# Patient Record
Sex: Male | Born: 1980
Health system: Southern US, Community
[De-identification: ages and names within clinical notes are randomized; demographics above are authoritative.]

## PROBLEM LIST (undated history)

## (undated) DIAGNOSIS — G43909 Migraine, unspecified, not intractable, without status migrainosus: Secondary | ICD-10-CM

## (undated) DIAGNOSIS — F209 Schizophrenia, unspecified: Secondary | ICD-10-CM

## (undated) DIAGNOSIS — F319 Bipolar disorder, unspecified: Secondary | ICD-10-CM

## (undated) HISTORY — DX: Migraine, unspecified, not intractable, without status migrainosus: G43.909

---

## 2014-06-29 ENCOUNTER — Emergency Department (HOSPITAL_COMMUNITY): Payer: Self-pay

## 2014-06-29 ENCOUNTER — Other Ambulatory Visit (HOSPITAL_COMMUNITY): Payer: Self-pay

## 2014-06-29 ENCOUNTER — Emergency Department (HOSPITAL_COMMUNITY)
Admission: EM | Admit: 2014-06-29 | Discharge: 2014-06-30 | Disposition: A | Discharge status: Home / Self Care | Payer: Self-pay | Attending: Emergency Medicine | Admitting: Emergency Medicine

## 2014-06-29 ENCOUNTER — Encounter (HOSPITAL_COMMUNITY): Payer: Self-pay | Admitting: *Deleted

## 2014-06-29 DIAGNOSIS — F1310 Sedative, hypnotic or anxiolytic abuse, uncomplicated: Secondary | ICD-10-CM | POA: Insufficient documentation

## 2014-06-29 DIAGNOSIS — F1910 Other psychoactive substance abuse, uncomplicated: Secondary | ICD-10-CM

## 2014-06-29 DIAGNOSIS — F1410 Cocaine abuse, uncomplicated: Secondary | ICD-10-CM | POA: Insufficient documentation

## 2014-06-29 DIAGNOSIS — F1210 Cannabis abuse, uncomplicated: Secondary | ICD-10-CM | POA: Insufficient documentation

## 2014-06-29 DIAGNOSIS — F29 Unspecified psychosis not due to a substance or known physiological condition: Secondary | ICD-10-CM | POA: Insufficient documentation

## 2014-06-29 DIAGNOSIS — F23 Brief psychotic disorder: Secondary | ICD-10-CM

## 2014-06-29 HISTORY — DX: Schizophrenia, unspecified: F20.9

## 2014-06-29 HISTORY — DX: Bipolar disorder, unspecified: F31.9

## 2014-06-29 LAB — CBC WITH DIFFERENTIAL/PLATELET
BASOS ABS: 0 10*3/uL (ref 0.0–0.1)
BASOS PCT: 0 % (ref 0–1)
EOS PCT: 0 % (ref 0–5)
Eosinophils Absolute: 0 10*3/uL (ref 0.0–0.7)
HCT: 35.1 % — ABNORMAL LOW (ref 39.0–52.0)
Hemoglobin: 12.1 g/dL — ABNORMAL LOW (ref 13.0–17.0)
LYMPHS PCT: 25 % (ref 12–46)
Lymphs Abs: 1.6 10*3/uL (ref 0.7–4.0)
MCH: 32.2 pg (ref 26.0–34.0)
MCHC: 34.5 g/dL (ref 30.0–36.0)
MCV: 93.4 fL (ref 78.0–100.0)
Monocytes Absolute: 0.6 10*3/uL (ref 0.1–1.0)
Monocytes Relative: 9 % (ref 3–12)
Neutro Abs: 4.2 10*3/uL (ref 1.7–7.7)
Neutrophils Relative %: 66 % (ref 43–77)
PLATELETS: 245 10*3/uL (ref 150–400)
RBC: 3.76 MIL/uL — ABNORMAL LOW (ref 4.22–5.81)
RDW: 12.5 % (ref 11.5–15.5)
WBC: 6.4 10*3/uL (ref 4.0–10.5)

## 2014-06-29 LAB — URINALYSIS, ROUTINE W REFLEX MICROSCOPIC
BILIRUBIN URINE: NEGATIVE
GLUCOSE, UA: 100 mg/dL — AB
KETONES UR: NEGATIVE mg/dL
Leukocytes, UA: NEGATIVE
NITRITE: NEGATIVE
PH: 5.5 (ref 5.0–8.0)
PROTEIN: NEGATIVE mg/dL
Specific Gravity, Urine: 1.005 (ref 1.005–1.030)
UROBILINOGEN UA: 0.2 mg/dL (ref 0.0–1.0)

## 2014-06-29 LAB — URINE MICROSCOPIC-ADD ON

## 2014-06-29 LAB — RAPID URINE DRUG SCREEN, HOSP PERFORMED
Amphetamines: NOT DETECTED
BARBITURATES: NOT DETECTED
Benzodiazepines: POSITIVE — AB
Cocaine: POSITIVE — AB
Opiates: NOT DETECTED
TETRAHYDROCANNABINOL: POSITIVE — AB

## 2014-06-29 LAB — CBG MONITORING, ED
GLUCOSE-CAPILLARY: 64 mg/dL — AB (ref 70–99)
Glucose-Capillary: 142 mg/dL — ABNORMAL HIGH (ref 70–99)
Glucose-Capillary: 68 mg/dL — ABNORMAL LOW (ref 70–99)
Glucose-Capillary: 129 mg/dL — ABNORMAL HIGH (ref 70–99)

## 2014-06-29 LAB — COMPREHENSIVE METABOLIC PANEL
ALBUMIN: 3.4 g/dL — AB (ref 3.5–5.2)
ALT: 16 U/L (ref 0–53)
AST: 23 U/L (ref 0–37)
Alkaline Phosphatase: 46 U/L (ref 39–117)
Anion gap: 9 (ref 5–15)
BUN: 8 mg/dL (ref 6–23)
CALCIUM: 8.4 mg/dL (ref 8.4–10.5)
CO2: 24 mmol/L (ref 19–32)
Chloride: 103 mmol/L (ref 96–112)
Creatinine, Ser: 1.33 mg/dL (ref 0.50–1.35)
GFR, EST AFRICAN AMERICAN: 43 mL/min — AB (ref >90)
GFR, EST NON AFRICAN AMERICAN: 37 mL/min — AB (ref >90)
Glucose, Bld: 78 mg/dL (ref 70–99)
Potassium: 3.4 mmol/L — ABNORMAL LOW (ref 3.5–5.1)
Sodium: 136 mmol/L (ref 135–145)
Total Bilirubin: 0.4 mg/dL (ref 0.3–1.2)
Total Protein: 6.1 g/dL (ref 6.0–8.3)

## 2014-06-29 LAB — ACETAMINOPHEN LEVEL: Acetaminophen (Tylenol), Serum: <10 ug/mL — ABNORMAL LOW (ref 10–30)

## 2014-06-29 LAB — ETHANOL: Alcohol, Ethyl (B): 117 mg/dL — ABNORMAL HIGH (ref 0–9)

## 2014-06-29 LAB — SALICYLATE LEVEL: Salicylate Lvl: <4 mg/dL (ref 2.8–20.0)

## 2014-06-29 MED ORDER — LORAZEPAM 2 MG/ML IJ SOLN
1.0000 mg | Freq: Once | INTRAMUSCULAR | Status: AC
  Administered 2014-06-29: 1 mg via INTRAVENOUS
  Filled 2014-06-29: qty 1

## 2014-06-29 MED ORDER — STERILE WATER FOR INJECTION IJ SOLN
INTRAMUSCULAR | Status: AC
  Filled 2014-06-29: qty 10

## 2014-06-29 MED ORDER — ZIPRASIDONE MESYLATE 20 MG IM SOLR
20.0000 mg | Freq: Once | INTRAMUSCULAR | Status: AC
  Administered 2014-06-29: 20 mg via INTRAMUSCULAR
  Filled 2014-06-29: qty 20

## 2014-06-29 MED ORDER — DEXTROSE 50 % IV SOLN
1.0000 | Freq: Once | INTRAVENOUS | Status: AC
  Administered 2014-06-29: 50 mL via INTRAVENOUS

## 2014-06-29 MED ORDER — SODIUM CHLORIDE 0.9 % IV BOLUS (SEPSIS)
1000.0000 mL | Freq: Once | INTRAVENOUS | Status: AC
  Administered 2014-06-29: 1000 mL via INTRAVENOUS

## 2014-06-29 NOTE — ED Notes (Signed)
Restraints removed at this time.

## 2014-06-29 NOTE — ED Notes (Addendum)
MD at bedside. MD aware of pts BP. MD to be notified if pts systolic goes below 90.

## 2014-06-29 NOTE — BH Assessment (Addendum)
[facility] Assessment Progress Note Spoke with Dr. Madilyn Hookees, took history of pt and requested staff to put telepsych machine in pt's room.  Will call at 16:50.  Becky, Rn Web designerinformed writer pt is still too sleepy to participate in assessment and they will put in another consult when he is ready.

## 2014-06-29 NOTE — ED Notes (Signed)
Gwen, [facility], to [facility] pt is ready for TTS.

## 2014-06-29 NOTE — ED Provider Notes (Signed)
Spoke with Ala DachFord from TTS.  Pt has not been cooperative with the exam.  Mostly sleeping. Plan is to admit the patient but there are no beds available yet.   Will continue to monitor.  Linwood DibblesJon Bader Stubblefield, MD 06/29/14 2023

## 2014-06-29 NOTE — ED Notes (Signed)
Notified RN [id number]

## 2014-06-29 NOTE — ED Notes (Signed)
Ronald BurtonEmily, [facility], attempting to perform TTS. Advised her pt remains too sedated to perform. Requested for RN to remove order and place again when pt awakens.

## 2014-06-29 NOTE — ED Notes (Signed)
[facility] unable to complete all of assessment due to pt still being drowsy. Will reattempt later.

## 2014-06-29 NOTE — ED Notes (Signed)
Sent transport to get patient, pt not ready at this time.  Pt combative, RN asked us to check back in about 30 minutes.

## 2014-06-29 NOTE — ED Notes (Signed)
[employer] remains at bedside with handcuffs in place. Pt verbal and VSS.

## 2014-06-29 NOTE — ED Notes (Signed)
Pt placed in Blue paper scrubs, Security has been paged.

## 2014-06-29 NOTE — BH Assessment (Addendum)
Tele Assessment Note   Ronald Stein is an 34 y.o. male, African-American who presents to [facility] ED voluntarily with law enforcement. Per ED report, Pt was found walking around, knocking people doors. He started responding to hallucinations and became combative once in the police car. TTS has been called three times today to assess Pt but Pt has been asleep. Pt continues to be very drowsy and fell asleep during assessment. He reports he has a history of schizophrenia and bipolar disorder and has been taking his medications as prescribed. He says he is from [place], [place] and is receiving outpatient treatment through an agency called [facility]. Pt says he has been hearing voices telling him that they are going to kill him. He reports he has heard these voices before and this episode has lasted "a little bit." Pt fell asleep and would not respond to more questions. Pt's urine drug screen is positive for cocaine, cannabis and benzodiazepines.   Axis I: Unspecified schizophrenia spectrum and other psychotic disorder, Cocaine Use Disorder; Cannabis Use Disorder Axis II: Deferred Axis III:  Past Medical History  Diagnosis Date  . Schizophrenia   . Bipolar 1 disorder    Axis IV: other psychosocial or environmental problems Axis V: GAF=25  Past Medical History:  Past Medical History  Diagnosis Date  . Schizophrenia   . Bipolar 1 disorder     No past surgical history on file.  Family History: No family history on file.  Social History:  has no tobacco, alcohol, and drug history on file.  Additional Social History:  Alcohol / Drug Use Pain Medications: Unable to assess Prescriptions: Unable to assess Over the Counter: Unable to assess History of alcohol / drug use?: Yes (Unable to assess) Longest period of sobriety (when/how long): Unable to assess Substance #1 Name of Substance 1: Cocaine 1 - Age of First Use: Unable to assess 1 - Amount (size/oz): Unable to assess 1 - Frequency:  Unable to assess 1 - Duration: Unable to assess 1 - Last Use / Amount: Unable to assess Substance #2 Name of Substance 2: Cannabis 2 - Age of First Use: Unable to assess 2 - Amount (size/oz): Unable to assess 2 - Frequency: Unable to assess 2 - Duration: Unable to assess 2 - Last Use / Amount: Unable to assess Substance #3 Name of Substance 3: Benzodiazepines 3 - Age of First Use: Unable to assess 3 - Amount (size/oz): Unable to assess 3 - Frequency: Unable to assess 3 - Duration: Unable to assess 3 - Last Use / Amount: Unable to assess  CIWA: CIWA-Ar BP: 96/73 mmHg Pulse Rate: 82 COWS:    PATIENT STRENGTHS: (choose at least two) Average or above average intelligence Communication skills  Allergies:  Allergies  Allergen Reactions  . Ciprofloxacin Hcl Swelling    Home Medications:  (Not in a hospital admission)  OB/GYN Status:  No LMP for male patient.  General Assessment Data Location of Assessment: [facility] ED Is this a Tele or Face-to-Face Assessment?: Tele Assessment Is this an Initial Assessment or a Re-assessment for this encounter?: Initial Assessment Living Arrangements: Other (Comment) (Unknown) Can pt return to current living arrangement?: No (Unknown) Admission Status: Involuntary Is patient capable of signing voluntary admission?: Yes Transfer from: Other (Comment) Referral Source:  Mudlogger)     [facility] Crisis Care Plan Living Arrangements: Other (Comment) (Unknown) Name of Psychiatrist: [facility] in [place], [place] Name of Therapist: [facility] in [place], [place]  Education Status Is patient currently in school?: No Current  Grade: NA Highest grade of school patient has completed: NA Name of school: NA Contact person: NA  Risk to self with the past 6 months Suicidal Ideation: No (Unable to assess) Suicidal Intent: No (Unable to assess) Is patient at risk for suicide?: No (Unable to assess) Suicidal Plan?: No (Unable to assess) Access to Means: No  (Unable to assess) What has been your use of drugs/alcohol within the last 12 months?: UDS positive for benzos, cocaine and cannabis Previous Attempts/Gestures: No (Unable to assess) How many times?: 0 (Unable to assess) Other Self Harm Risks: Unable to assess Triggers for Past Attempts: Other (Comment) (Unable to assess) Intentional Self Injurious Behavior: None (Unable to assess) Family Suicide History: Unknown Recent stressful life event(s): Other (Comment) (Unable to assess) Persecutory voices/beliefs?: Yes Depression: Yes Depression Symptoms: Insomnia, Loss of interest in usual pleasures Substance abuse history and/or treatment for substance abuse?: Yes Suicide prevention information given to non-admitted patients: Not applicable  Risk to Others within the past 6 months Homicidal Ideation: No (Unable to assess) Thoughts of Harm to Others: No (Unable to assess) Current Homicidal Intent: No (Unable to assess) Current Homicidal Plan: No (Unable to assess) Access to Homicidal Means: No (Unable to assess) Identified Victim: None History of harm to others?:  (Unable to assess) Assessment of Violence: None Noted (Unable to assess) Violent Behavior Description: Unable to assess Does patient have access to weapons?: No (Unable to assess) Criminal Charges Pending?: No (Unable to assess) Does patient have a court date: No (Unable to assess)  Psychosis Hallucinations: Auditory (Hearing voices saying they will kill him) Delusions: Persecutory  Mental Status Report Appear/Hygiene: In hospital gown Eye Contact: Poor Motor Activity: Unremarkable Speech: Logical/coherent Level of Consciousness: Drowsy, Sleeping Mood: Pleasant Affect: Blunted Anxiety Level: None Thought Processes: Coherent, Relevant Judgement: Unable to Assess Orientation: Unable to assess Obsessive Compulsive Thoughts/Behaviors: Unable to Assess  Cognitive Functioning Concentration: Unable to Assess Memory:  Unable to Assess IQ: Average Insight: Unable to Assess Impulse Control: Unable to Assess Appetite: Fair (Unable to assess) Weight Loss: 0 (Unable to assess) Weight Gain: 0 (Unable to assess) Sleep: Unable to Assess Total Hours of Sleep: 0 (Unable to assess) Vegetative Symptoms: Unable to Assess  ADLScreening [facility]) Patient's cognitive ability adequate to safely complete daily activities?: Yes Patient able to express need for assistance with ADLs?: Yes Independently performs ADLs?: Yes (appropriate for developmental age)  Prior Inpatient Therapy Prior Inpatient Therapy:  (Unable to assess) Prior Therapy Dates: Unable to assess Prior Therapy Facilty/Provider(s): Unable to assess Reason for Treatment: Unable to assess  Prior Outpatient Therapy Prior Outpatient Therapy: Yes Prior Therapy Dates: Current Prior Therapy Facilty/Provider(s): [facility] in [place], [place] Reason for Treatment: Unable to assess  ADL Screening (condition at time of admission) Patient's cognitive ability adequate to safely complete daily activities?: Yes Patient able to express need for assistance with ADLs?: Yes Independently performs ADLs?: Yes (appropriate for developmental age)       Abuse/Neglect Assessment (Assessment to be complete while patient is alone) Physical Abuse:  (Unable to assess) Verbal Abuse:  (Unable to assess) Sexual Abuse:  (Unable to assess) Exploitation of patient/patient's resources:  (Unable to assess) Self-Neglect:  (Unable to assess)     Merchant navy officer (For Healthcare) Does patient have an advance directive?: No Would patient like information on creating an advanced directive?: No - patient declined information    Additional Information 1:1 In Past 12 Months?: No (Unable to assess) CIRT Risk: No (Unable to assess) Elopement Risk: No (  Unable to assess) Does patient have medical clearance?: Yes     Disposition: Binnie RailJoann Glover, [facility],  confirms adult unit is currently at capacity. Consulted with Maryjean Mornharles Kober, PA agrees Pt meets criteria for inpatient psychiatric treatment but there is current insufficient clinical information to place Pt at another facility. TTS will re-assess Pt when he is alert and oriented. Notified Dr. Iantha FallenJohn Knapp of current disposition.  Disposition Initial Assessment Completed for this Encounter: Yes Disposition of Patient: Inpatient treatment program Type of inpatient treatment program: Adult   Pamalee LeydenFord Ellis Edyn Popoca Jr, [facility], [facility] Triage Specialist [phone]   Pamalee LeydenWarrick Jr, Rika Daughdrill Ellis 06/29/2014 7:56 PM

## 2014-06-29 NOTE — ED Provider Notes (Addendum)
CSN: [id number]     Arrival date & time 06/29/14  4098 History   First MD Initiated Contact with Patient 06/29/14 [phone]     Chief Complaint  Patient presents with  . Manic Behavior    The history is provided by the EMS personnel and the police. No language interpreter was used.   Ronald Stein presents for evaluation of altered mental status. He was initially found by police to be knocking on doors saying that somebody was going to kill him. He agreed to get into the police vehicle when he became altered and EMS was called. EMS found him to be diaphoretic and agitated, threatening to his other people, and appeared to be hallucinating. They treated him for excited delirium with IV fluids and 2-1/2 mg of midazolam IM. Per EMS report he has a history of schizophrenia and bipolar disorder and he is not currently compliant with his medications. Symptoms are severe and constant. Level V caveat due to altered mental status.  No past medical history on file. No past surgical history on file. No family history on file. History  Substance Use Topics  . Smoking status: Not on file  . Smokeless tobacco: Not on file  . Alcohol Use: Not on file    Review of Systems  Unable to perform ROS     Allergies  Review of patient's allergies indicates not on file.  Home Medications   Prior to Admission medications   Not on File   There were no vitals taken for this visit. Physical Exam  Constitutional: He appears well-developed and well-nourished.  HENT:  Head: Normocephalic and atraumatic.  Eyes:  Pupils dilated and reactive bilaterally  Neck: Neck supple.  Cardiovascular:  Tachycardic, no murmur  Pulmonary/Chest: Effort normal and breath sounds normal. No respiratory distress.  Abdominal: Soft. There is no tenderness. There is no rebound and no guarding.  Neurological: He is alert.  Moves all extremities symmetrically and strongly  Skin: Skin is warm and dry.  Psychiatric:  Agitated and  difficult to redirect  Nursing note and vitals reviewed.   ED Course  Procedures (including critical care time) CRITICAL CARE Performed by: Tilden Fossa   Total critical care time: 30 minutes  Critical care time was exclusive of separately billable procedures and treating other patients.  Critical care was necessary to treat or prevent imminent or life-threatening deterioration.  Critical care was time spent personally by me on the following activities: development of treatment plan with patient and/or surrogate as well as nursing, discussions with consultants, evaluation of patient's response to treatment, examination of patient, obtaining history from patient or surrogate, ordering and performing treatments and interventions, ordering and review of laboratory studies, ordering and review of radiographic studies, pulse oximetry and re-evaluation of patient's condition.   Labs Review Labs Reviewed  COMPREHENSIVE METABOLIC PANEL - Abnormal; Notable for the following:    Potassium 3.4 (*)    Albumin 3.4 (*)    GFR calc non Af Amer 37 (*)    GFR calc Af Amer 43 (*)    All other components within normal limits  ETHANOL - Abnormal; Notable for the following:    Alcohol, Ethyl (B) 117 (*)    All other components within normal limits  CBC WITH DIFFERENTIAL/PLATELET - Abnormal; Notable for the following:    RBC 3.76 (*)    Hemoglobin 12.1 (*)    HCT 35.1 (*)    All other components within normal limits  URINALYSIS, ROUTINE W REFLEX MICROSCOPIC -  Abnormal; Notable for the following:    Glucose, UA 100 (*)    Hgb urine dipstick TRACE (*)    All other components within normal limits  URINE RAPID DRUG SCREEN (HOSP PERFORMED) - Abnormal; Notable for the following:    Cocaine POSITIVE (*)    Benzodiazepines POSITIVE (*)    Tetrahydrocannabinol POSITIVE (*)    All other components within normal limits  ACETAMINOPHEN LEVEL - Abnormal; Notable for the following:    Acetaminophen  (Tylenol), Serum <10.0 (*)    All other components within normal limits  CBG MONITORING, ED - Abnormal; Notable for the following:    Glucose-Capillary 64 (*)    All other components within normal limits  CBG MONITORING, ED - Abnormal; Notable for the following:    Glucose-Capillary 142 (*)    All other components within normal limits  SALICYLATE LEVEL  URINE MICROSCOPIC-ADD ON    Imaging Review Ct Head Wo Contrast  06/29/2014   CLINICAL DATA:  Male patient, unknown age. History of schizophrenia and bipolar disorder with altered mental status.  EXAM: CT HEAD WITHOUT CONTRAST  TECHNIQUE: Contiguous axial images were obtained from the base of the skull through the vertex without intravenous contrast.  COMPARISON:  None.  FINDINGS: Unremarkable appearance of the calvarium without acute fracture or aggressive lesion.  Unremarkable appearance of the scalp soft tissues.  Unremarkable appearance of the bilateral orbits.  Portions of the visualized cartilaginous nasal septum are dehisced.  Mastoid air cells are clear.  Trace paranasal sinus disease of the ethmoid air cells with lobulated soft tissue at the posterior aspect of the left maxillary sinus and at the anterior aspect of the left maxillary sinus.  No acute intracranial hemorrhage, midline shift, or mass effect.  Gray-white differentiation is maintained, without CT evidence of acute ischemia.  Unremarkable configuration of the ventricles.  IMPRESSION: No CT evidence of acute intracranial abnormality.  Dehiscence of the cartilaginous nasal septum. This can be seen in the setting of chronic trauma, chronic granulomatous disease such as Wegener's or sarcoidosis, or alternatively can be the result of chronic use of vasoactive substances.  Trace paranasal sinus disease.  Signed,  Yvone Neu. Loreta Ave, DO  [facility]  [facility]   Electronically Signed   By: Gilmer Mor D.O.   On: 06/29/2014 11:31   Dg Chest  Port 1 View  06/29/2014   CLINICAL DATA:  34 year old male unresponsive with low oxygen saturations.  EXAM: PORTABLE CHEST - 1 VIEW  COMPARISON:  No priors.  FINDINGS: Lung volumes are low. No consolidative airspace disease. No pleural effusions. No pneumothorax. No pulmonary nodule or mass noted. Pulmonary vasculature and the cardiomediastinal silhouette are within normal limits.  IMPRESSION: Low lung volumes without radiographic evidence of acute cardiopulmonary disease.   Electronically Signed   By: Trudie Reed M.D.   On: 06/29/2014 09:31     EKG Interpretation   Date/Time:  Saturday June 29 2014 09:03:29 EST Ventricular Rate:  88 PR Interval:  183 QRS Duration: 95 QT Interval:  383 QTC Calculation: 463 R Axis:   26 Text Interpretation:  Sinus rhythm Right atrial enlargement ST elev,  probable normal early repol pattern Confirmed by Lincoln Brigham [phone]) on  06/29/2014 9:15:28 AM      MDM   Final diagnoses:  Acute psychosis  Polysubstance abuse   Patient brought in by EMS and police department for agitation, altered mental status and bizarre behavior. Patient is with excited delirium and acute psychosis. He  needed to be sedated with Geodon and Ativan for patient and staff safety. On recheck spelled apartment patient is somnolent but arousable. Waiting for chemical sedation to clear for further psychiatric evaluation. There is no evidence of trauma on examination and CT head without acute intracranial abnormality. Clinical picture is not consistent with meningitis or encephalitis.  1230 pt recheck - pt somnolent but arousable, no complaints.  Not currently awake enough for psychiatric eval.    1530 pt recheck - more arousable but continues to be too somnolent for full psychiatric evaluation.  Plan to evaluate by psych pending clearance of geodon and ativan.    Tilden FossaElizabeth Ainsleigh Kakos, MD 06/29/14 1543  06/30/2014 recheck at 10:30 AM. Patient is calm and oriented. He denies any SI or  HI. He has no active hallucinations at this time. He is compliant with his home medications but did take cocaine recently. He has been evaluated by TTS. Patient is no longer actively psychotic. Plan to have discharged home with continuing home medications and avoiding cocaine with close outpatient follow-up. Return precautions were discussed.  Tilden FossaElizabeth Ryhanna Dunsmore, MD 06/30/14 [phone]

## 2014-06-29 NOTE — ED Notes (Signed)
TTS at bedside at this time.  

## 2014-06-29 NOTE — ED Notes (Signed)
Per EMS and [employer]- pt was walking around knocking on on peoples doors. Pt voluntarily went with [employer] however became combative and hallucinating once in car. Pt has hx of bipolar and schizophrenia. Pt states that he as not taken his medication. Pt arrived in handcuffs which remain with [employer] present at bedside.

## 2014-06-29 NOTE — ED Notes (Signed)
Pharm Tech in w/pt. 

## 2014-06-29 NOTE — ED Notes (Signed)
MD at bedside. 
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
## 2014-06-30 NOTE — ED Notes (Signed)
Per Marchelle FolksAmanda, [facility], pt needs telepsych and referrals d/t moved to area x 2 weeks ago and has not established w/PCP or psych.

## 2014-06-30 NOTE — Discharge Instructions (Signed)
Continue taking your medications as prescribed.  Do not use cocaine.    Psychosis Psychosis refers to a severe lack of understanding with reality. During a psychotic episode, you are not able to think clearly. During a psychotic episode, your responses and emotions are inappropriate and do not coincide with what is actually happening. You often have false beliefs about what is happening or who you are (delusions), and you may see, hear, taste, smell, or feel things that are not present (hallucinations). Psychosis is usually a severe symptom of a very serious mental health (psychiatric) condition, but it can sometimes be the result of a medical condition. CAUSES   Psychiatric conditions, such as:  Schizophrenia.  Bipolar disorder.  Depression.  Personality disorders.  Alcohol or drug abuse.  Medical conditions, such as:  Brain injury.  Brain tumor.  Dementia.  Brain diseases, such as Alzheimer's, Parkinson's, or Huntington's disease.  Neurological diseases, such as epilepsy.  Genetic disorders.  Metabolic disorders.  Infections that affect the brain.  Certain prescription drugs.  Stroke. SYMPTOMS   Unable to think or speak clearly or respond appropriately.  Disorganized thinking (thoughts jump from one thought to another).  Severe inappropriate behavior.  Delusions may include:  A strong belief that is odd, unrealistic, or false.  Feeling extremely fearful or suspicious (paranoid).  Believing you are someone else, have high importance, or have an altered identity.  Hallucinations. DIAGNOSIS   Mental health evaluation.  Physical exam.  Blood tests.  Computerized magnetic scan (MRI) or other brain scans. TREATMENT  Your caregiver will recommend a course of treatment that depends on the cause of the psychosis. Treatment may include:  Monitoring and supportive care in the hospital.  Taking medicines (antipsychotic medicine) to reduce symptoms and  balance chemicals in the brain.  Taking medicines to manage underlying mental health conditions.  Therapy and other supportive programs outside of the hospital.  Treating an underlying medical condition. If the cause of the psychosis can be treated or corrected, the outlook is good. Without treatment, psychotic episodes can cause danger to yourself or others. Treatment may be short-term or lifelong. HOME CARE INSTRUCTIONS   Take all medicines as directed. This is important.  Use a pillbox or write down your medicine schedule to make sure you are taking them.  Check with your caregiver before using over-the-counter medicines, herbs, or supplements.  Seek individual and family support through therapy and mental health education (psychoeducation) programs. These will help you manage symptoms and side effects of medicines, learn life skills, and maintain a healthy routine.  Maintain a healthy lifestyle.  Exercise regularly.  Avoid alcohol and drugs.  Learn ways to reduce stress and cope with stress, such as yoga and meditation.  Talk about your feelings with family members or caregivers.  Make time for yourself to do things you enjoy.  Know the early warning signs of psychosis. Your caregiver will recommend steps to take when you notice symptoms such as:  Feeling anxious or preoccupied.  Having racing thoughts.  Changes in your interest in life and relationships.  Follow up with your caregivers for continued outpatient treatment as directed. SEEK MEDICAL CARE IF:   Medicines do not seem to be helping.  You hear voices telling you to do things.  You see, smell, or feel things that are not there.  You feel hopeless and overwhelmed.  You feel extremely fearful and suspicious that something will harm you.  You feel like you cannot leave your house.  You have  trouble taking care of yourself.  You experience side effects of medicines, such as changes in sleep patterns,  dizziness, weight gain, restlessness, movement changes, muscle spasms, or tremors. SEEK IMMEDIATE MEDICAL CARE IF:  Severe psychotic symptoms present a safety issue (such as an urge to hurt yourself or others). MAKE SURE YOU:   Understand these instructions.  Will watch your condition.  Will get help right away if you are not doing well or get worse. FOR MORE INFORMATION  National Institute of Mental Health: http://www.maynard.net/ Document Released: 11/04/2009 Document Revised: 08/09/2011 Document Reviewed: 11/04/2009 [facility] Patient Information 2015 [place], [place]. This information is not intended to replace advice given to you by your health care provider. Make sure you discuss any questions you have with your health care provider.  Emergency Department Resource Guide 1) Find a Doctor and Pay Out of Pocket Although you won't have to find out who is covered by your insurance plan, it is a good idea to ask around and get recommendations. You will then need to call the office and see if the doctor you have chosen will accept you as a new patient and what types of options they offer for patients who are self-pay. Some doctors offer discounts or will set up payment plans for their patients who do not have insurance, but you will need to ask so you aren't surprised when you get to your appointment.  2) Contact Your Local Health Department Not all health departments have doctors that can see patients for sick visits, but many do, so it is worth a call to see if yours does. If you don't know where your local health department is, you can check in your phone book. The CDC also has a tool to help you locate your state's health department, and many state websites also have listings of all of their local health departments.  3) Find a Walk-in Clinic If your illness is not likely to be very severe or complicated, you may want to try a walk in clinic. These are popping up all over the country in pharmacies,  drugstores, and shopping centers. They're usually staffed by nurse practitioners or physician assistants that have been trained to treat common illnesses and complaints. They're usually fairly quick and inexpensive. However, if you have serious medical issues or chronic medical problems, these are probably not your best option.  No Primary Care Doctor: - Call Health Connect at  [phone] - they can help you locate a primary care doctor that  accepts your insurance, provides certain services, etc. - Physician Referral Service- [phone]  Chronic Pain Problems: Organization         Address  Phone   Notes  [facility]  [phone] Patients need to be referred by their primary care doctor.   Medication Assistance: Organization         Address  Phone   Notes  [facility] Medication [facility] [address] [place] [place], [place] [address] [phone] --Must be a resident of [facility] -- Must have NO insurance coverage whatsoever (no Medicaid/ Medicare, etc.) -- The pt. MUST have a primary care doctor that directs their care regularly and follows them in the community   MedAssist  [phone]   [employer]  [phone]    Agencies that provide inexpensive medical care: Organization         Address  Phone   Notes  [facility]  [phone]  [facility]    [phone]   [facility] [address] [place], [place] [address] [phone]   [facility] [address], [place] [phone]   Planned Parenthood    [phone]   [facility]    [phone]) [phone]   [facility]  [address], [place] Phone:  [phone], Fax:  [phone] Hours of Operation:  9 am - 6 pm, M-F.  Also accepts Medicaid/Medicare and self-pay.  [facility]  [address], [place] Phone:  [phone], Fax: [phone]. Hours of Operation:  8:30 am - 5:30 pm, M-F.  Also accepts Medicaid and self-pay.  [facility] [place] [address], [place] Phone: [phone](336) [phone]   [facility] [address], [place], [place] [phone], Ext. [phone] Mondays & Thursdays: 7-9 AM.  First 15 patients are seen on a first come, first serve basis.    Medicaid-accepting [facility] Providers:  Organization         Address  Phone   Notes  [facility] [address], [place] Redge [phone] Also accepts self-pay patients.  [facility] [address], [place]  [phone]   [facility] [address], [place] [phone]) [phone]   [facility] [address] [place] [address], [place] [phone]   Renaye RakersVeita Bland [address], [place]   [phone] Only accepts [place] Access [place]icaid patients after they have their name applied to their card.   Self-Pay (no insurance) in [facility]:  Organization         Address  Phone   Notes  Sickle Cell Patients, [facility] [address], [place] [phone]) [phone]   [facility] [address], [place] [phone]   [facility] [place]  [address], [place] [phone]   [facility]/Dr. Osei-Bonsu  [address] [place] [address], [place] or [phone], [place] [phone]) [phone] Phone number for both [place] and [place] locations is the same.  [facility] [address], [place] [phone]) [phone]   [facility] [place] [address] [place] [address], [place] or [address] [phone]   [facility] [address], [place] [phone](336) [phone], phone; [phone](336) [phone], fax Sees patients 1st and 3rd Saturday of every month.  Must not qualify for public  or private insurance (i.e. Medicaid, Medicare, [place] Health Choice, Veterans' Benefits)  Household income should be no more than 200% of the poverty level The clinic cannot treat you if you are pregnant or think you are pregnant  Sexually transmitted diseases are not treated at the clinic.    Dental Care: Organization         Address  Phone  Notes  [facility] [address], [place] [phone] Accepts children up to age 34 who are enrolled in [place]icaid or [place] Health Choice; pregnant women with a Medicaid card; and children who have applied for Medicaid or [place] Health Choice, but were declined, whose parents can pay a reduced fee at time of service.  [facility] [place]  [address],  [place] [phone]) [phone] Accepts children up to age 34 who are enrolled in Medicaid or [place] Health Choice; pregnant women with a Medicaid card; and children who have applied for Medicaid or [place] Health Choice, but were declined, whose parents can pay a reduced fee at time of service.  [facility]  [address], [place] [phone] Patients are seen by appointment only. Walk-ins are not accepted. [facility] will see patients 34 years of age and older. Monday - Tuesday (8am-5pm) Most Wednesdays (8:30-5pm) $30 per visit, cash only  [facility]  [address], [facility] [phone] Patients are seen by appointment only. Walk-ins are not accepted. [facility] will see patients 318 years of age and older. One Wednesday Evening (Monthly: Volunteer Based).  $30 per visit, cash only  [employer] [facility]  [phone] for adults; Children under age 384, call Graduate Pediatric Dentistry at [phone]. Children aged 844-14, please call [phone] to request a pediatric application.  Dental services are provided in all areas of  dental care including fillings, crowns and bridges, complete and partial dentures, implants, gum treatment, root canals, and extractions. Preventive care is also provided. Treatment is provided to both adults and children. Patients are selected via a lottery and there is often a waiting list.   [facility] [address], [place]  [phone] [email or web address]   [facility] [address], [place], [place] [phone], Ext. [phone] Second and Fourth Thursday of each month, opens at 6:30 AM; Clinic ends at 9 AM.  Patients are seen on a first-come first-served basis, and a limited number are seen during each clinic.   [facility]  [address], [place], [place] [phone]   Eligibility Requirements You must have lived in [place], [place], or [place] counties for at least the last three months.   You cannot be eligible for state or federal sponsored National Cityhealthcare insurance, including CIGNAVeterans Administration, [place]icaid, or [employer] EntertainmentMedicare.   You generally cannot be eligible for healthcare insurance through your employer.    How to apply: Eligibility screenings are held every Tuesday and Wednesday afternoon from 1:00 pm until 4:00 pm. You do not need an appointment for the interview!  [facility] [address], [place], [place] [phone]   [facility]  [phone]   [facility]  [phone]   [facility]  [phone]    Behavioral Health Resources in the Community: Intensive Outpatient Programs Organization         Address  Phone  Notes  [facility] [address], [place], [place] [phone]   [facility] [address], [place], [place] [phone]   [facility] [address], [place], [place]  [phone]   [facility] [address],  [place], [place] [phone] or  [phone]   Substance Abuse Resources Organization         Address  Phone  Notes  [facility]  [phone]   [facility]  [phone]   [facility]  [phone]   [facility]  [phone]   Residential & Outpatient Substance Abuse Program  [phone]   Psychological Services Organization         Address  Phone  Notes  [facility]  [phone]- [phone]   [facility]  [phone]   [facility] [address], [place] [phone] or [phone]    Mobile Crisis Teams Organization         Address  Phone  Notes  [facility], Mobile Crisis Care Unit  [phone]   [facility]  [address] [place], [place] [phone]   Doristine LocksSharon DeEsch [address] [place] [phone]    Self-Help/Support Groups Organization         Address  Phone             Notes  [facility] [place] - variety of support groups  336- [phone] Call for more information  Narcotics Anonymous (NA), [facility] [address], [place]  2 meetings at this location   Statisticianesidential Treatment Programs Organization         Address  Phone  Notes  [facility] [address],    [place]  [phone]   [facility]  [address], [place] [phone], [place], [place] [phone]   [facility] [address], [place] [phone] Admissions: 8am-3pm M-F  [facility] [address]    [place], [place] [phone]   [facility] [address], [place], [place] [phone]   [facility] [address]  [place], [place] [phone]   [facility] - Intensive Outpatient [address], [place], [place] [phone]   [facility] ([facility] [address]  [place], [place] [phone] or [phone]   [facility]idential  Treatment Services (RTS) [address] [place], [place] [phone] Accepts Medicaid  [facility] [address]  [place] [phone] Substance Abuse/Addiction Treatment   [facility]al Health Resources Organization         Address  Phone  Notes  [facility]  [phone]   Angie FavaJulie Brannon, PhD [address], [address] [place], [place]   [phone](336) [phone] or [phone]) [phone]   [facility]   [address] [place], [place] [phone]) [phone]   [facility] [address], [place], [place] [phone]) [phone] Insurance/Medicaid/sponsorship through [facility]  [facility] [address]                                    [place], [place] [phone]) [phone] Therapy/tele-psych/case  [facility] [address].   [place], [place] [phone](336) [phone]    Dr. Lolly MustacheArfeen  [phone]) [phone]   [facility]  [employer] [facility] 1) [address], [place] 2) [address], [place] 3)  [address], [place] [phone](336) [phone]  [phone]   [facility] Child Abuse Hotline [phone]) [phone] or [phone]) [phone] (After Hours)

## 2014-06-30 NOTE — ED Notes (Signed)
PER AMANDA, [facility], WILL FAX REFERRALS FOR PT.

## 2014-06-30 NOTE — ED Notes (Signed)
TTS being performed.  

## 2014-08-15 ENCOUNTER — Encounter (HOSPITAL_COMMUNITY): Payer: Self-pay | Admitting: Emergency Medicine

## 2014-08-15 ENCOUNTER — Emergency Department (HOSPITAL_COMMUNITY)
Admission: EM | Admit: 2014-08-15 | Discharge: 2014-08-16 | Disposition: A | Discharge status: Home / Self Care | Payer: Self-pay | Attending: Emergency Medicine | Admitting: Emergency Medicine

## 2014-08-15 DIAGNOSIS — F209 Schizophrenia, unspecified: Secondary | ICD-10-CM | POA: Insufficient documentation

## 2014-08-15 DIAGNOSIS — F1410 Cocaine abuse, uncomplicated: Secondary | ICD-10-CM | POA: Insufficient documentation

## 2014-08-15 DIAGNOSIS — F1310 Sedative, hypnotic or anxiolytic abuse, uncomplicated: Secondary | ICD-10-CM | POA: Insufficient documentation

## 2014-08-15 DIAGNOSIS — F1210 Cannabis abuse, uncomplicated: Secondary | ICD-10-CM | POA: Insufficient documentation

## 2014-08-15 DIAGNOSIS — F1910 Other psychoactive substance abuse, uncomplicated: Secondary | ICD-10-CM

## 2014-08-15 DIAGNOSIS — R404 Transient alteration of awareness: Secondary | ICD-10-CM

## 2014-08-15 LAB — COMPREHENSIVE METABOLIC PANEL
ALT: 24 U/L (ref 0–53)
AST: 37 U/L (ref 0–37)
Albumin: 3.3 g/dL — ABNORMAL LOW (ref 3.5–5.2)
Alkaline Phosphatase: 52 U/L (ref 39–117)
Anion gap: 12 (ref 5–15)
BUN: 10 mg/dL (ref 6–23)
CO2: 23 mmol/L (ref 19–32)
CREATININE: 1.21 mg/dL (ref 0.50–1.35)
Calcium: 8.4 mg/dL (ref 8.4–10.5)
Chloride: 101 mmol/L (ref 96–112)
GFR calc Af Amer: 90 mL/min — ABNORMAL LOW (ref >90)
GFR, EST NON AFRICAN AMERICAN: 77 mL/min — AB (ref >90)
GLUCOSE: 85 mg/dL (ref 70–99)
Potassium: 3.4 mmol/L — ABNORMAL LOW (ref 3.5–5.1)
SODIUM: 136 mmol/L (ref 135–145)
Total Bilirubin: 0.4 mg/dL (ref 0.3–1.2)
Total Protein: 6.1 g/dL (ref 6.0–8.3)

## 2014-08-15 LAB — CBC WITH DIFFERENTIAL/PLATELET
Basophils Absolute: 0 10*3/uL (ref 0.0–0.1)
Basophils Relative: 0 % (ref 0–1)
EOS ABS: 0 10*3/uL (ref 0.0–0.7)
EOS PCT: 0 % (ref 0–5)
HEMATOCRIT: 35.6 % — AB (ref 39.0–52.0)
Hemoglobin: 12.3 g/dL — ABNORMAL LOW (ref 13.0–17.0)
Lymphocytes Relative: 25 % (ref 12–46)
Lymphs Abs: 1.9 10*3/uL (ref 0.7–4.0)
MCH: 32.6 pg (ref 26.0–34.0)
MCHC: 34.6 g/dL (ref 30.0–36.0)
MCV: 94.4 fL (ref 78.0–100.0)
MONOS PCT: 7 % (ref 3–12)
Monocytes Absolute: 0.5 10*3/uL (ref 0.1–1.0)
NEUTROS PCT: 68 % (ref 43–77)
Neutro Abs: 5.2 10*3/uL (ref 1.7–7.7)
PLATELETS: 205 10*3/uL (ref 150–400)
RBC: 3.77 MIL/uL — AB (ref 4.22–5.81)
RDW: 12.1 % (ref 11.5–15.5)
WBC: 7.6 10*3/uL (ref 4.0–10.5)

## 2014-08-15 LAB — ETHANOL: ALCOHOL ETHYL (B): 14 mg/dL — AB (ref 0–9)

## 2014-08-15 LAB — CK: Total CK: 705 U/L — ABNORMAL HIGH (ref 7–232)

## 2014-08-15 LAB — ACETAMINOPHEN LEVEL

## 2014-08-15 LAB — SALICYLATE LEVEL: Salicylate Lvl: <4 mg/dL (ref 2.8–20.0)

## 2014-08-15 LAB — RAPID URINE DRUG SCREEN, HOSP PERFORMED
Amphetamines: NOT DETECTED
BENZODIAZEPINES: POSITIVE — AB
Barbiturates: NOT DETECTED
Cocaine: POSITIVE — AB
Opiates: NOT DETECTED
Tetrahydrocannabinol: POSITIVE — AB

## 2014-08-15 LAB — CBG MONITORING, ED: GLUCOSE-CAPILLARY: 81 mg/dL (ref 70–99)

## 2014-08-15 MED ORDER — SODIUM CHLORIDE 0.9 % IV BOLUS (SEPSIS)
1000.0000 mL | Freq: Once | INTRAVENOUS | Status: AC
  Administered 2014-08-15: 1000 mL via INTRAVENOUS

## 2014-08-15 MED ORDER — ONDANSETRON HCL 4 MG PO TABS: 4.0000 mg | ORAL_TABLET | Freq: Three times a day (TID) | ORAL | Status: DC | PRN

## 2014-08-15 MED ORDER — ACETAMINOPHEN 325 MG PO TABS: 650.0000 mg | ORAL_TABLET | ORAL | Status: DC | PRN

## 2014-08-15 MED ORDER — ALUM & MAG HYDROXIDE-SIMETH 200-200-20 MG/5ML PO SUSP: 30.0000 mL | ORAL | Status: DC | PRN

## 2014-08-15 MED ORDER — LORAZEPAM 1 MG PO TABS
1.0000 mg | ORAL_TABLET | ORAL | Status: DC | PRN
  Administered 2014-08-15: 1 mg via ORAL
  Filled 2014-08-15: qty 1

## 2014-08-15 MED ORDER — NICOTINE 21 MG/24HR TD PT24
21.0000 mg | MEDICATED_PATCH | Freq: Every day | TRANSDERMAL | Status: DC
  Administered 2014-08-15 – 2014-08-16 (×2): 21 mg via TRANSDERMAL
  Filled 2014-08-15 (×2): qty 1

## 2014-08-15 MED ORDER — ZOLPIDEM TARTRATE 5 MG PO TABS: 5.0000 mg | ORAL_TABLET | Freq: Every evening | ORAL | Status: DC | PRN

## 2014-08-15 MED ORDER — SERTRALINE HCL 50 MG PO TABS
100.0000 mg | ORAL_TABLET | Freq: Every day | ORAL | Status: DC
  Administered 2014-08-15 – 2014-08-16 (×2): 100 mg via ORAL
  Filled 2014-08-15 (×2): qty 2

## 2014-08-15 NOTE — [facility] Counselor (Signed)
Counselor spoke with Pt's RN. RN reports that Pt is too drowsy to complete assessment. Counselor asked for consult to be removed and re-submitted when the Pt is alert. RN agreed.  Wolfgang PhoenixBrandi Abbott Jasinski, [facility] Triage Specialist

## 2014-08-15 NOTE — ED Notes (Signed)
Pt CBG result 81. Informed RN.

## 2014-08-15 NOTE — ED Notes (Signed)
Placed pt in purple scrubs, bagged pt's belongings (Jeans, belt, cut shirt)

## 2014-08-15 NOTE — ED Notes (Signed)
MD Wickline at bedside. 

## 2014-08-15 NOTE — ED Notes (Signed)
Seizure pads placed on bed rails. Pt still asleep.

## 2014-08-15 NOTE — ED Notes (Signed)
Patient given juice and crackers.

## 2014-08-15 NOTE — ED Notes (Signed)
Pt speaking with TTS now.  

## 2014-08-15 NOTE — BH Assessment (Addendum)
Tele Assessment Note   Ronald Stein is an 34 y.o. male. [facility]. Pt's roommate believed that the Pt was having a seizure and contacted EMS. Per Pt's roommate the Pt was incoherent. Pt denies SI/HI. Pt denies delusions and hallucinations. Pt states that he was not having a seizure but that he was being delivered by God. According to the Pt, he was in prayer and his body began to convulse and purge. Pt states that purging consists of mucus coming out of his mouth. Pt attributes his behavior to prayer and deliverance. Pt denies mental health diagnosis. Past documentation reports diagnosis of Schizophrenia. Pt reports being prescribed Zoloft. Pt denies prior inpatient treatment. Past documentation reports hospitalization at [facility]. Pt denies outpatient treatment. Pt was poor historian. Pt was drowsy and irritable throughout the assessment. Pt repeatedly stated "I don't feel like answering all of these questions."  Writer consulted with Lloyd HugerNeil, NP. Per Lloyd HugerNeil Pt meets inpatient criteria. No [facility] beds. TTS to seek placement.         Axis I: Schizophrenia Axis II: Deferred Axis III:  Past Medical History  Diagnosis Date  . Schizophrenia   . Bipolar 1 disorder    Axis IV: educational problems, housing problems, other psychosocial or environmental problems, problems related to social environment, problems with access to health care services and problems with primary support group Axis V: 31-40 impairment in reality testing  Past Medical History:  Past Medical History  Diagnosis Date  . Schizophrenia   . Bipolar 1 disorder     History reviewed. No pertinent past surgical history.  Family History: No family history on file.  Social History:  has no tobacco, alcohol, and drug history on file.  Additional Social History:  Alcohol / Drug Use Pain Medications: Pt denies Prescriptions: Zoloft Over the Counter: Pt denies  History of alcohol / drug use?: Yes Longest period of  sobriety (when/how long): Pt states he does not know. Negative Consequences of Use: Financial, Legal, Personal relationships, Work / School Substance #1 Name of Substance 1: Cocaine 1 - Age of First Use: Pt states he does not remember 1 - Amount (size/oz): Pt states he does not remember 1 - Frequency: Pt states whenever he can get it 1 - Duration: Ongoing 1 - Last Use / Amount: 08/13/14  CIWA: CIWA-Ar BP: 121/70 mmHg Pulse Rate: 78 COWS:    PATIENT STRENGTHS: (choose at least two) Average or above average intelligence Communication skills  Allergies:  Allergies  Allergen Reactions  . Ciprofloxacin Hcl Swelling    Home Medications:  (Not in a hospital admission)  OB/GYN Status:  No LMP for male patient.  General Assessment Data [facility] Is this a Tele or Face-to-Face Assessment?: Tele Assessment Is this an Initial Assessment or a Re-assessment for this encounter?: Initial Assessment Living Arrangements: Non-relatives/Friends Can pt return to current living arrangement?: Yes Admission Status: Voluntary Is patient capable of signing voluntary admission?: Yes Transfer from: Home Referral Source: Self/Family/Friend     [facility] Living Arrangements: Non-relatives/Friends Name of Psychiatrist: None Name of Therapist: None  Education Status Is patient currently in school?: No Current Grade: NA Highest grade of school patient has completed: 8 Name of school: NA Contact person: NA  Risk to self with the past 6 months Suicidal Ideation: No Suicidal Intent: No Is patient at risk for suicide?: No Suicidal Plan?: No Access to Means: No What has been your use of drugs/alcohol within the last  12 months?: Pt reports cocaine use  Previous Attempts/Gestures: No How many times?: 0 Other Self Harm Risks: NA Triggers for Past Attempts: None known Intentional Self Injurious Behavior: None Family Suicide History: No Recent stressful life  event(s): Other (Comment) (Reports none) Persecutory voices/beliefs?: No Depression: No Depression Symptoms:  (reports none) Substance abuse history and/or treatment for substance abuse?: Yes Suicide prevention information given to non-admitted patients: Not applicable  Risk to Others within the past 6 months Homicidal Ideation: No Thoughts of Harm to Others: No Current Homicidal Intent: No Current Homicidal Plan: No Access to Homicidal Means: No Identified Victim: NA History of harm to others?: No Assessment of Violence: None Noted Violent Behavior Description: NA Does patient have access to weapons?: No Criminal Charges Pending?: No Does patient have a court date: No  Psychosis Hallucinations: Auditory Delusions: Persecutory  Mental Status Report Appearance/Hygiene: In scrubs Eye Contact: Poor Motor Activity: Freedom of movement Speech: Logical/coherent Level of Consciousness: Drowsy, Irritable Mood: Angry, Irritable Affect: Appropriate to circumstance Anxiety Level: Minimal Thought Processes: Coherent Judgement: Impaired Orientation: Person, Situation, Place, Time Obsessive Compulsive Thoughts/Behaviors: None  Cognitive Functioning Concentration: Normal Memory: Recent Intact, Remote Intact IQ: Average Insight: Fair Impulse Control: Fair Appetite: Fair Weight Loss: 0 Weight Gain: 0 Sleep: Decreased Total Hours of Sleep: 3 Vegetative Symptoms: None  ADLScreening [facility] Assessment Services) Patient's cognitive ability adequate to safely complete daily activities?: Yes Patient able to express need for assistance with ADLs?: Yes Independently performs ADLs?: Yes (appropriate for developmental age)  Prior Inpatient Therapy Prior Inpatient Therapy: No Prior Therapy Dates: NA Prior Therapy Facilty/Provider(s): NA Reason for Treatment: NA  Prior Outpatient Therapy Prior Outpatient Therapy: Yes Prior Therapy Dates: January 2016 Prior Therapy  Facilty/Provider(s): [facility] in [place], [place] Reason for Treatment: Auditory Hallucinations  ADL Screening (condition at time of admission) Patient's cognitive ability adequate to safely complete daily activities?: Yes Is the patient deaf or have difficulty hearing?: No Does the patient have difficulty seeing, even when wearing glasses/contacts?: No Does the patient have difficulty concentrating, remembering, or making decisions?: No Patient able to express need for assistance with ADLs?: Yes Does the patient have difficulty dressing or bathing?: No Independently performs ADLs?: Yes (appropriate for developmental age) Does the patient have difficulty walking or climbing stairs?: No       Abuse/Neglect Assessment (Assessment to be complete while patient is alone) Physical Abuse: Denies Verbal Abuse: Denies Sexual Abuse: Denies Exploitation of patient/patient's resources: Denies Self-Neglect: Denies     Merchant navy officer (For Healthcare) Does patient have an advance directive?: No Would patient like information on creating an advanced directive?: No - patient declined information    Additional Information 1:1 In Past 12 Months?: No CIRT Risk: No Elopement Risk: No Does patient have medical clearance?: Yes     Disposition:  Disposition Initial Assessment Completed for this Encounter: Yes Type of inpatient treatment program: Adult  Yanelle Sousa D 08/15/2014 9:22 AM

## 2014-08-15 NOTE — ED Notes (Signed)
Per EMS, pt from home. Roommate called EMS because she thought pt was having seizure like activity. Per EMS, pt was not having a seizure, but was thrashing around and incoherent. Pt has hx of bipolar and schizophrenia, but has not been taking his medication. EMS gave 5 mg of Haldol IV with no change, then gave 5 mg of Versed IV PTA. Pt also received 200 mL fluid bolus. VSS. Pt asleep upon assessment.

## 2014-08-15 NOTE — Progress Notes (Signed)
CSW attempting to secure inpatient placement. Referrals were faxed to the following hospitals: [facility] - [facility], fax referral, possible discahrges [facility], [facility], fax referral [facility]- [facility], one bed, fax referral [facility]- [facility]- left voicemail and faxed referral [facility]-faxed referral [facility]- fax for waitlist  Lanika Colgate MSW, LCSWA Disposition Social Worker 08/15/2014    

## 2014-08-15 NOTE — ED Notes (Signed)
Phlebotomy at bedside.

## 2014-08-15 NOTE — ED Notes (Signed)
Notified Behavioral Health that pt is now involuntarily committed.

## 2014-08-15 NOTE — ED Provider Notes (Signed)
CSN: [id number]     Arrival date & time 08/15/14  0150 History  This chart was scribed for Zadie Rhineonald Kaeya Schiffer, MD by Bronson CurbJacqueline Melvin, ED Scribe. This patient was seen in room D32C/D32C and the patient's care was started at 2:09 AM.    Chief Complaint  Patient presents with  . Altered Mental Status  . Aggressive Behavior   LEVEL 5 CAVEAT: ALTERED MENTAL STATUS  Patient is a 34 y.o. male presenting with altered mental status. The history is provided by medical records. The history is limited by the condition of the patient. No language interpreter was used.  Altered Mental Status    HPI Comments: Ronald Stein is a 34 y.o. male, with history of schizophrenia and bipolar 1 disorder, brought in by ambulance, who presents to the Emergency Department for altered mental status that began PTA. Per nursing note, roommate thought patient was having a seizure and called EMS. Upon their arrival, EMS reports the patient was not having a seizure, but was thrashing, incoherent, and combative. 5mg  of Haldol IV was given with no change in patient. EMS then gave 5mg  of Versed IV, PTA. Patient is noncompliant with psych medications.    Past Medical History  Diagnosis Date  . Schizophrenia   . Bipolar 1 disorder    History reviewed. No pertinent past surgical history. No family history on file. History  Substance Use Topics  . Smoking status: Unknown If Ever Smoked  . Smokeless tobacco: Not on file  . Alcohol Use: Not on file    Review of Systems  Unable to perform ROS: Mental status change      Allergies  Ciprofloxacin hcl  Home Medications   Prior to Admission medications   Medication Sig Start Date End Date Taking? Authorizing Provider  dextromethorphan (DELSYM) 30 MG/5ML liquid Take 30 mg by mouth 3 (three) times daily as needed for cough.    Historical Provider, MD  QUEtiapine Fumarate (SEROQUEL PO) Take 400 mg by mouth at bedtime.    Historical Provider, MD  sertraline (ZOLOFT) 100 MG  tablet Take 100 mg by mouth daily.    Historical Provider, MD   Triage Vitals: BP 104/55 mmHg  Pulse 73  Temp(Src) 97.1 F (36.2 C) (Axillary)  Resp 18  SpO2 100%  Physical Exam  Nursing note and vitals reviewed.  CONSTITUTIONAL: Well developed/well nourished. Patient is somnolent. HEAD: Normocephalic/atraumatic EYES: PERRL ENMT: Mucous membranes dry NECK: supple no meningeal signs SPINE/BACK:no bruising noted to back CV: S1/S2 noted, no murmurs/rubs/gallops noted LUNGS: Lungs are clear to auscultation bilaterally, no apparent distress ABDOMEN: soft, nontender, no rebound or guarding, bowel sounds noted throughout abdomen NEURO: Pt is somnolent. Intermittently will wake up, moves all extremities and go back to sleep EXTREMITIES: pulses normal/equal, full ROM SKIN: warm, color normal PSYCH: unable to assess  ED Course  Procedures  CRITICAL CARE Performed by: Joya GaskinsWICKLINE,Sharee Sturdy W Total critical care time: 33 Critical care time was exclusive of separately billable procedures and treating other patients. Critical care was necessary to treat or prevent imminent or life-threatening deterioration. Critical care was time spent personally by me on the following activities: development of treatment plan with patient and/or surrogate as well as nursing, discussions with consultants, evaluation of patient's response to treatment, examination of patient, obtaining history from patient or surrogate, ordering and performing treatments and interventions, ordering and review of laboratory studies, ordering and review of radiographic studies, pulse oximetry and re-evaluation of patient's condition. PATIENT WITH ALTERED MENTAL STATUS, PT GIVEN HALDOL/VERSED BY EMS,  PT MONITORED IN THE EMERGENCY DEPARTMENT  DIAGNOSTIC STUDIES: Oxygen Saturation is 100% on room air, normal by my interpretation.   Pt sleeping at this time after was given multiple meds by EMS He has h/o schizophrenia and apparently he  was aggressive with EMS Recent ED evaluation reveals negative CT head - will defer for now Will need close monitoring  No evidence of trauma is noted  4:36 AM PT RESTING COMFORTABLY HE IS AFEBRILE LABS UNREMARKABLE WILL NEED RE-EVALUATION AFTER HIS MENTAL STATUS IMPROVED HE IS AROUSABLE TO VOICE/PAIN 7:25 AM Pt sleeping but arousable When I ask about what happened last night he reports "Something supernatural" At this point, it appears he is medically stable as I suspect the episodes last night were caused by his psychiatric illness telepsych has been consulted Home meds ordered BP 110/64 mmHg  Pulse 66  Temp(Src) 96.3 F (35.7 C) (Rectal)  Resp 18  SpO2 100%   Labs Review Labs Reviewed  COMPREHENSIVE METABOLIC PANEL - Abnormal; Notable for the following:    Potassium 3.4 (*)    Albumin 3.3 (*)    GFR calc non Af Amer 77 (*)    GFR calc Af Amer 90 (*)    All other components within normal limits  CBC WITH DIFFERENTIAL/PLATELET - Abnormal; Notable for the following:    RBC 3.77 (*)    Hemoglobin 12.3 (*)    HCT 35.6 (*)    All other components within normal limits  CK - Abnormal; Notable for the following:    Total CK 705 (*)    All other components within normal limits  ETHANOL - Abnormal; Notable for the following:    Alcohol, Ethyl (B) 14 (*)    All other components within normal limits  ACETAMINOPHEN LEVEL - Abnormal; Notable for the following:    Acetaminophen (Tylenol), Serum <10.0 (*)    All other components within normal limits  SALICYLATE LEVEL  URINE RAPID DRUG SCREEN (HOSP PERFORMED)  CBG MONITORING, ED     EKG Interpretation   Date/Time:  Thursday August 15 2014 02:08:19 EDT Ventricular Rate:  73 PR Interval:  175 QRS Duration: 92 QT Interval:  437 QTC Calculation: 482 R Axis:   57 Text Interpretation:  Sinus rhythm ST elev, probable normal early repol  pattern No significant change since last tracing Confirmed by Bebe Shaggy   MD, Verneal Wiers ([id number])  on 08/15/2014 2:14:39 AM      MDM   Final diagnoses:  Schizophrenia, unspecified type  Transient alteration of awareness    Nursing notes including past medical history and social history reviewed and considered in documentation Labs/vital reviewed myself and considered during evaluation Previous records reviewed and considered    I personally performed the services described in this documentation, which was scribed in my presence. The recorded information has been reviewed and is accurate.      Zadie Rhine, MD 08/15/14 [phone]

## 2014-08-15 NOTE — Progress Notes (Addendum)
[facility] called inquiring patient's date of birth. Information provided.  CSW followed up with the hospitals that pt has been referred to: [facility]  Busy with patients, needs follow up in am. [facility] - no response [facility] - sent to voicemail [facility] - still going through the stack  [facility] not [facility]. Referral re-faxed.  Will continue to seek placement.  Melbourne Abtsatia Rini Moffit, LCSWA Disposition staff 08/15/2014 6:39 PM

## 2014-08-15 NOTE — ED Notes (Signed)
Pt too sleepy to have TTS meeting. Spoke with rep from TTS and was told to cancel order for now and place order when pt more alert.

## 2014-08-15 NOTE — ED Notes (Signed)
Resting soundly at this time.  No distress noted.  Awakens to verbal stimuli.

## 2014-08-15 NOTE — ED Notes (Signed)
Pt wanting to leave, stating "I am not sick."  Pt putting clothes on and pulling on IV.  MD Radford PaxBeaton notified, requested IVC.  Pt went to bathroom and back to room. Paperwork completed by MD, pt resting in room at this time, NAD.

## 2014-08-15 NOTE — ED Notes (Signed)
Sitter requested with Staffing.

## 2014-08-16 DIAGNOSIS — F209 Schizophrenia, unspecified: Secondary | ICD-10-CM

## 2014-08-16 LAB — CK: CK TOTAL: 242 U/L — AB (ref 7–232)

## 2014-08-16 NOTE — ED Notes (Signed)
Ivc resinded. Belongings returned to patient

## 2014-08-16 NOTE — ED Notes (Signed)
Regular diet tray, no sharps, ordered for breakfast via call in.

## 2014-08-16 NOTE — Consult Note (Signed)
Telepsych Consultation   Reason for Consult:  Psychiatric Re-evalutaion Referring Physician:  EDP Patient Identification: Ronald Stein MRN:  [id number] Principal Diagnosis: Schizoaffective Disorder Diagnosis:  There are no active problems to display for this patient.   Total Time spent with patient: 20 minutes  Subjective:   Ronald Stein is a 34 y.o. male patient who states "I really don't know why I'm here." He states he was praying and then started shaking and "my roommate must have gotten scared because she called the ambulance."   HPI:  Ronald Stein is a 34 yo [place] American male seen today at [facility] ED via tele-psychiatry. He is alert & oriented, calm and cooperative for this assessment.  He states he had been praying to God about him using cocaine and marijuana. He stated he was praying and "began purging himself and foaming at the mouth." He states he was non-threatening. He states he has past psychiatric diagnoses of Bipolar disorder and Schizoaffective Disorder and he currently takes Zoloft and Seroquel. He states he has not had either medication in the past 3 weeks since he moved here from [place]. He states he was receiving outpatient care at [facility] in [place] but has not been able to establish an outpatient provider here in [place].   Today, he denies suicidal or homicidal ideation, intent or plan. He denies prior history of suicide attempts. He reports 2 prior hospitalizations in 2013 at [facility] in [place] and states this was when he was first diagnosed. He reports using marijuana and cocaine; last marijuana use was 1 week ago and last cocaine use was 2 weeks ago.   HPI Elements:   Location:  Mood. Quality:  substance induced mood disorder. Severity:  mild to moderate. Timing:  acute exacerbation. Duration:  onset 3 years ago. Context:  substance abuse, medication non-compliance.  Past Medical History:  Past Medical History  Diagnosis Date  .  Schizophrenia   . Bipolar 1 disorder    History reviewed. No pertinent past surgical history. Family History: No family history on file. Social History:  History  Alcohol Use: Not on file     History  Drug Use Not on file    History   Social History  . Marital Status: Single    Spouse Name: N/A  . Number of Children: N/A  . Years of Education: N/A   Social History Main Topics  . Smoking status: Unknown If Ever Smoked  . Smokeless tobacco: Not on file  . Alcohol Use: Not on file  . Drug Use: Not on file  . Sexual Activity: Not on file   Other Topics Concern  . None   Social History Narrative   Additional Social History:    Pain Medications: Pt denies Prescriptions: Zoloft Over the Counter: Pt denies  History of alcohol / drug use?: Yes Longest period of sobriety (when/how long): Pt states he does not know. Negative Consequences of Use: Financial, Legal, Personal relationships, Work / School Name of Substance 1: Cocaine 1 - Age of First Use: Pt states he does not remember 1 - Amount (size/oz): Pt states he does not remember 1 - Frequency: Pt states whenever he can get it 1 - Duration: Ongoing 1 - Last Use / Amount: 08/13/14  Allergies:   Allergies  Allergen Reactions  . Ciprofloxacin Hcl Swelling    Vitals: Blood pressure 123/83, pulse 69, temperature 98.1 F (36.7 C), temperature source Oral, resp. rate 20, SpO2 99 %.  Risk to Self: Suicidal Ideation: No  Suicidal Intent: No Is patient at risk for suicide?: No Suicidal Plan?: No Access to Means: No What has been your use of drugs/alcohol within the last 12 months?: Pt reports cocaine use  How many times?: 0 Other Self Harm Risks: NA Triggers for Past Attempts: None known Intentional Self Injurious Behavior: None Risk to Others: Homicidal Ideation: No Thoughts of Harm to Others: No Current Homicidal Intent: No Current Homicidal Plan: No Access to Homicidal Means: No Identified Victim: NA History of  harm to others?: No Assessment of Violence: None Noted Violent Behavior Description: NA Does patient have access to weapons?: No Criminal Charges Pending?: No Does patient have a court date: No Prior Inpatient Therapy: Prior Inpatient Therapy: No Prior Therapy Dates: NA Prior Therapy Facilty/Provider(s): NA Reason for Treatment: NA Prior Outpatient Therapy: Prior Outpatient Therapy: Yes Prior Therapy Dates: January 2016 Prior Therapy Facilty/Provider(s[place] [facility] [place] Reason for Treatment: Auditory Hallucinations  Current Facility-Administered Medications  Medication Dose Route Frequency Provider Last Rate Last Dose  . acetaminophen (TYLENOL) tablet 650 mg  650 mg Oral Q4H PRN Zadie Rhineonald Wickline, MD      . alum & mag hydroxide-simeth (MAALOX/MYLANTA) 200-200-20 MG/5ML suspension 30 mL  30 mL Oral PRN Nelva Nayobert Beaton, MD      . LORazepam (ATIVAN) tablet 1 mg  1 mg Oral Q4H PRN Zadie Rhineonald Wickline, MD   1 mg at 08/15/14 1252  . nicotine (NICODERM CQ - dosed in mg/24 hours) patch 21 mg  21 mg Transdermal Daily Nelva Nayobert Beaton, MD   21 mg at 08/16/14 1018  . ondansetron (ZOFRAN) tablet 4 mg  4 mg Oral Q8H PRN Nelva Nayobert Beaton, MD      . sertraline (ZOLOFT) tablet 100 mg  100 mg Oral Daily Zadie Rhineonald Wickline, MD   100 mg at 08/16/14 1018  . zolpidem (AMBIEN) tablet 5 mg  5 mg Oral QHS PRN Nelva Nayobert Beaton, MD       Current Outpatient Prescriptions  Medication Sig Dispense Refill  . dextromethorphan (DELSYM) 30 MG/5ML liquid Take 30 mg by mouth 3 (three) times daily as needed for cough.    . QUEtiapine Fumarate (SEROQUEL PO) Take 400 mg by mouth at bedtime.    . sertraline (ZOLOFT) 100 MG tablet Take 100 mg by mouth daily.      Musculoskeletal: Strength & Muscle Tone: uanble to assess; patient seen via tele-psychiatry Gait & Station: uanble to assess; patient seen via tele-psychiatry Patient leans: uanble to assess; patient seen via tele-psychiatry  Psychiatric Specialty Exam:     Blood  pressure 123/83, pulse 69, temperature 98.1 F (36.7 C), temperature source Oral, resp. rate 20, SpO2 99 %.There is no height or weight on file to calculate BMI.  General Appearance: Fairly Groomed  Patent attorneyye Contact::  Good  Speech:  Clear and Coherent and Normal Rate  Volume:  Normal  Mood:  Anxious  Affect:  Congruent  Thought Process:  Coherent  Orientation:  Full (Time, Place, and Person)  Thought Content:  WDL  Suicidal Thoughts:  No  Homicidal Thoughts:  No  Memory:  Immediate;   Good Recent;   Good Remote;   Good  Judgement:  Intact  Insight:  Fair  Psychomotor Activity:  Restlessness  Concentration:  Good  Recall:  Good  Fund of Knowledge:Fair  Language: Good  Akathisia:  No  Handed:  Right  AIMS (if indicated):     Assets:  Communication Skills Desire for Improvement Housing  ADL's:  Intact  Cognition: WNL  Sleep:  Medical Decision Making: Established Problem, Stable/Improving (1), Review of Psycho-Social Stressors (1), Review or order clinical lab tests (1) and Review of Medication Regimen & Side Effects (2)   Plan:  No evidence of imminent risk to self or others at present.   Patient does not meet criteria for psychiatric inpatient admission.  Disposition: -Patient may be discharged home when medically cleared -Outpatient resources for [facility] to be given at time of discharge  Alberteen Sam, [facility] 08/16/2014 2:44 PM

## 2014-08-16 NOTE — Progress Notes (Signed)
Patient has been cleared from Psych. Met with regarding outpatient resources. Patient reports he was living in [place] for 10 years, moved to [place] and is now back permanently.  Patient was given information for the [facility] to get a duplicate ID, [facility] for outpatient services, and PCP information regarding [facility] and orange card.    Patient agreeable to follow up. Will return to current living situation and friend can come and pick patient up when ready for discharged.  Lane Hacker, MSW Clinical Social Work: Emergency Room [phone]

## 2014-08-16 NOTE — ED Notes (Signed)
Patient is awake , alert and oriented. He states that last night he was praying and became filled with the spirt. ( pt is pentec[employer]al holiness religion). States he had been praying for the spirit to take the bad out of him and he began purging (drooling spit out of his mouth) and shaking his legs and body when the spirit came and was removing the bad. He states he did not have a seizure. That his friend should have called the pastor instead of the ambulance. He denies any si/hi, any auditory or visual hallucinations. He is alert and oriented. He is pleasant and appropriate.

## 2014-08-16 NOTE — Discharge Instructions (Signed)
Polysubstance Abuse °When people abuse more than one drug or type of drug it is called polysubstance or polydrug abuse. For example, many smokers also drink alcohol. This is one form of polydrug abuse. Polydrug abuse also refers to the use of a drug to counteract an unpleasant effect produced by another drug. It may also be used to help with withdrawal from another drug. People who take stimulants may become agitated. Sometimes this agitation is countered with a tranquilizer. This helps protect against the unpleasant side effects. Polydrug abuse also refers to the use of different drugs at the same time.  °Anytime drug use is interfering with normal living activities, it has become abuse. This includes problems with family and friends. Psychological dependence has developed when your mind tells you that the drug is needed. This is usually followed by physical dependence which has developed when continuing increases of drug are required to get the same feeling or "high". This is known as addiction or chemical dependency. A person's risk is much higher if there is a history of chemical dependency in the family. °SIGNS OF CHEMICAL DEPENDENCY °· You have been told by friends or family that drugs have become a problem. °· You fight when using drugs. °· You are having blackouts (not remembering what you do while using). °· You feel sick from using drugs but continue using. °· You lie about use or amounts of drugs (chemicals) used. °· You need chemicals to get you going. °· You are suffering in work performance or in school because of drug use. °· You get sick from use of drugs but continue to use anyway. °· You need drugs to relate to people or feel comfortable in social situations. °· You use drugs to forget problems. °"Yes" answered to any of the above signs of chemical dependency indicates there are problems. The longer the use of drugs continues, the greater the problems will become. °If there is a family history of  drug or alcohol use, it is best not to experiment with these drugs. Continual use leads to tolerance. After tolerance develops more of the drug is needed to get the same feeling. This is followed by addiction. With addiction, drugs become the most important part of life. It becomes more important to take drugs than participate in the other usual activities of life. This includes relating to friends and family. Addiction is followed by dependency. Dependency is a condition where drugs are now needed not just to get high, but to feel normal. °Addiction cannot be cured but it can be stopped. This often requires outside help and the care of professionals. Treatment centers are listed in the yellow pages under: Cocaine, Narcotics, and Alcoholics Anonymous. Most hospitals and clinics can refer you to a specialized care center. Talk to your caregiver if you need help. °Document Released: 01/06/2005 Document Revised: 08/09/2011 Document Reviewed: 05/17/2005 °ExitCare® Patient Information ©2015 [employer]. This information is not intended to replace advice given to you by your health care provider. Make sure you discuss any questions you have with your health care provider. ° °

## 2015-07-04 DIAGNOSIS — Z818 Family history of other mental and behavioral disorders: Secondary | ICD-10-CM | POA: Diagnosis not present

## 2015-07-04 DIAGNOSIS — J029 Acute pharyngitis, unspecified: Secondary | ICD-10-CM | POA: Diagnosis not present

## 2015-07-04 DIAGNOSIS — F259 Schizoaffective disorder, unspecified: Secondary | ICD-10-CM | POA: Diagnosis not present

## 2015-07-04 DIAGNOSIS — Z753 Unavailability and inaccessibility of health-care facilities: Secondary | ICD-10-CM | POA: Diagnosis not present

## 2015-07-04 DIAGNOSIS — F0391 Unspecified dementia with behavioral disturbance: Secondary | ICD-10-CM | POA: Diagnosis not present

## 2015-07-04 DIAGNOSIS — M6282 Rhabdomyolysis: Secondary | ICD-10-CM | POA: Diagnosis not present

## 2015-07-04 DIAGNOSIS — Z599 Problem related to housing and economic circumstances, unspecified: Secondary | ICD-10-CM | POA: Diagnosis not present

## 2015-07-04 DIAGNOSIS — X509XXA Other and unspecified overexertion or strenuous movements or postures, initial encounter: Secondary | ICD-10-CM | POA: Diagnosis not present

## 2015-07-04 DIAGNOSIS — Y929 Unspecified place or not applicable: Secondary | ICD-10-CM | POA: Diagnosis not present

## 2015-07-04 DIAGNOSIS — F29 Unspecified psychosis not due to a substance or known physiological condition: Secondary | ICD-10-CM | POA: Diagnosis not present

## 2015-07-04 DIAGNOSIS — N179 Acute kidney failure, unspecified: Secondary | ICD-10-CM | POA: Diagnosis not present

## 2015-07-04 DIAGNOSIS — Z653 Problems related to other legal circumstances: Secondary | ICD-10-CM | POA: Diagnosis not present

## 2015-07-04 DIAGNOSIS — F17200 Nicotine dependence, unspecified, uncomplicated: Secondary | ICD-10-CM | POA: Diagnosis not present

## 2015-07-04 DIAGNOSIS — F602 Antisocial personality disorder: Secondary | ICD-10-CM | POA: Diagnosis not present

## 2015-07-05 DIAGNOSIS — T796XXA Traumatic ischemia of muscle, initial encounter: Secondary | ICD-10-CM | POA: Diagnosis not present

## 2015-07-05 DIAGNOSIS — Z818 Family history of other mental and behavioral disorders: Secondary | ICD-10-CM | POA: Diagnosis not present

## 2015-07-05 DIAGNOSIS — F17200 Nicotine dependence, unspecified, uncomplicated: Secondary | ICD-10-CM | POA: Diagnosis not present

## 2015-07-05 DIAGNOSIS — F602 Antisocial personality disorder: Secondary | ICD-10-CM | POA: Diagnosis not present

## 2015-07-05 DIAGNOSIS — N179 Acute kidney failure, unspecified: Secondary | ICD-10-CM | POA: Diagnosis not present

## 2015-07-05 DIAGNOSIS — M6282 Rhabdomyolysis: Secondary | ICD-10-CM | POA: Diagnosis not present

## 2015-07-05 DIAGNOSIS — Y929 Unspecified place or not applicable: Secondary | ICD-10-CM | POA: Diagnosis not present

## 2015-07-05 DIAGNOSIS — Z653 Problems related to other legal circumstances: Secondary | ICD-10-CM | POA: Diagnosis not present

## 2015-07-05 DIAGNOSIS — F259 Schizoaffective disorder, unspecified: Secondary | ICD-10-CM | POA: Diagnosis not present

## 2015-07-05 DIAGNOSIS — J029 Acute pharyngitis, unspecified: Secondary | ICD-10-CM | POA: Diagnosis not present

## 2015-07-05 DIAGNOSIS — Z599 Problem related to housing and economic circumstances, unspecified: Secondary | ICD-10-CM | POA: Diagnosis not present

## 2015-07-05 DIAGNOSIS — X509XXA Other and unspecified overexertion or strenuous movements or postures, initial encounter: Secondary | ICD-10-CM | POA: Diagnosis not present

## 2015-07-05 DIAGNOSIS — Z753 Unavailability and inaccessibility of health-care facilities: Secondary | ICD-10-CM | POA: Diagnosis not present

## 2015-07-06 DIAGNOSIS — N179 Acute kidney failure, unspecified: Secondary | ICD-10-CM | POA: Diagnosis not present

## 2015-07-06 DIAGNOSIS — T796XXA Traumatic ischemia of muscle, initial encounter: Secondary | ICD-10-CM | POA: Diagnosis not present

## 2016-01-06 DIAGNOSIS — S0083XA Contusion of other part of head, initial encounter: Secondary | ICD-10-CM | POA: Diagnosis not present

## 2016-01-06 DIAGNOSIS — S199XXA Unspecified injury of neck, initial encounter: Secondary | ICD-10-CM | POA: Diagnosis not present

## 2016-01-06 DIAGNOSIS — Z23 Encounter for immunization: Secondary | ICD-10-CM | POA: Diagnosis not present

## 2016-01-06 DIAGNOSIS — F319 Bipolar disorder, unspecified: Secondary | ICD-10-CM | POA: Diagnosis not present

## 2016-01-06 DIAGNOSIS — J320 Chronic maxillary sinusitis: Secondary | ICD-10-CM | POA: Diagnosis not present

## 2016-01-06 DIAGNOSIS — Z720 Tobacco use: Secondary | ICD-10-CM | POA: Diagnosis not present

## 2016-01-06 DIAGNOSIS — S0502XA Injury of conjunctiva and corneal abrasion without foreign body, left eye, initial encounter: Secondary | ICD-10-CM | POA: Diagnosis not present

## 2016-01-06 DIAGNOSIS — S0993XA Unspecified injury of face, initial encounter: Secondary | ICD-10-CM | POA: Diagnosis not present

## 2016-01-06 DIAGNOSIS — M799 Soft tissue disorder, unspecified: Secondary | ICD-10-CM | POA: Diagnosis not present

## 2016-01-06 DIAGNOSIS — M47812 Spondylosis without myelopathy or radiculopathy, cervical region: Secondary | ICD-10-CM | POA: Diagnosis not present

## 2016-01-06 DIAGNOSIS — S0990XA Unspecified injury of head, initial encounter: Secondary | ICD-10-CM | POA: Diagnosis not present

## 2016-01-06 DIAGNOSIS — J329 Chronic sinusitis, unspecified: Secondary | ICD-10-CM | POA: Diagnosis not present

## 2016-06-29 DIAGNOSIS — F250 Schizoaffective disorder, bipolar type: Secondary | ICD-10-CM | POA: Diagnosis not present

## 2016-11-18 ENCOUNTER — Encounter (HOSPITAL_COMMUNITY): Payer: Self-pay | Admitting: Emergency Medicine

## 2016-11-18 ENCOUNTER — Emergency Department (HOSPITAL_COMMUNITY)
Admission: EM | Admit: 2016-11-18 | Discharge: 2016-11-18 | Disposition: A | Discharge status: Home / Self Care | Payer: Medicare Other | Attending: Emergency Medicine | Admitting: Emergency Medicine

## 2016-11-18 DIAGNOSIS — F17210 Nicotine dependence, cigarettes, uncomplicated: Secondary | ICD-10-CM | POA: Diagnosis not present

## 2016-11-18 DIAGNOSIS — M545 Low back pain, unspecified: Secondary | ICD-10-CM

## 2016-11-18 MED ORDER — NAPROXEN 250 MG PO TABS
500.0000 mg | ORAL_TABLET | Freq: Once | ORAL | Status: AC
  Administered 2016-11-18: 500 mg via ORAL
  Filled 2016-11-18: qty 2

## 2016-11-18 MED ORDER — METHOCARBAMOL 500 MG PO TABS: 500.0000 mg | ORAL_TABLET | Freq: Two times a day (BID) | ORAL | 0 refills | Status: DC

## 2016-11-18 MED ORDER — NAPROXEN 500 MG PO TABS: 500.0000 mg | ORAL_TABLET | Freq: Two times a day (BID) | ORAL | 0 refills | Status: DC

## 2016-11-18 MED ORDER — METHOCARBAMOL 500 MG PO TABS
500.0000 mg | ORAL_TABLET | Freq: Once | ORAL | Status: AC
  Administered 2016-11-18: 500 mg via ORAL
  Filled 2016-11-18: qty 1

## 2016-11-18 NOTE — Discharge Instructions (Signed)
Take your medications as prescribed. You may also apply ice and/or heat to affected area for 15-20 minutes 3-4 times daily for additional pain relief. I recommend refraining from doing any heavy lifting, squatting or repetitive movements that exacerbate her symptoms for the next few days. °Follow-up with your primary care provider in the next week if your symptoms have not improved. °Please return to the Emergency Department if symptoms worsen or new onset of denies fever, numbness, tingling, groin anesthesia, loss of bowel or bladder, weakness, chest pain, abdominal pain, vomiting.  °

## 2016-11-18 NOTE — ED Provider Notes (Signed)
[facility] Provider Note   CSN: [id number] Arrival date & time: 11/18/16  0535     History   Chief Complaint Chief Complaint  Patient presents with  . Back Pain    HPI Ronald Stein is a 36 y.o. male.  HPI   Patient is a 36 year old male with no pertinent past medical history presents to the ED with complaint of low back pain, onset one week. Patient reports after helping a few friends move furniture he began to notice pain in his lower back 1-2 days later. He reports having constant aching pain across his lower back that intermittently radiates up his back. He states pain is worse with movement, bending or lifting. He states he has had mild relief when applying warm compresses to his lower back. Denies relief with ibuprofen he has been taking at home. Patient denies any other recent fall, trauma or injury. Pt denies fever, numbness, tingling, saddle anesthesia, loss of bowel or bladder, weakness, CP, SOB, abdominal pain, N/V, IVDU, cancer or recent spinal manipulation.   Past Medical History:  Diagnosis Date  . Bipolar 1 disorder (HCC)   . Schizophrenia [facility](HCC)     Patient Active Problem List   Diagnosis Date Noted  . Schizophrenia, unspecified type (HCC)     History reviewed. No pertinent surgical history.     Home Medications    Prior to Admission medications   Medication Sig Start Date End Date Taking? Authorizing Provider  dextromethorphan (DELSYM) 30 MG/5ML liquid Take 30 mg by mouth 3 (three) times daily as needed for cough.    [provider]  methocarbamol (ROBAXIN) 500 MG tablet Take 1 tablet (500 mg total) by mouth 2 (two) times daily. 11/18/16   Barrett HenleNadeau, Nicole Elizabeth, PA-C  naproxen (NAPROSYN) 500 MG tablet Take 1 tablet (500 mg total) by mouth 2 (two) times daily. 11/18/16   Barrett HenleNadeau, Nicole Elizabeth, PA-C  QUEtiapine Fumarate (SEROQUEL PO) Take 400 mg by mouth at bedtime.    [provider]  sertraline (ZOLOFT) 100 MG tablet Take  100 mg by mouth daily.    [provider]    Family History No family history on file.  Social History Social History  Substance Use Topics  . Smoking status: Current Every Day Smoker    Packs/day: 0.25    Types: Cigarettes  . Smokeless tobacco: Never Used  . Alcohol use Yes     Comment: occasionally     Allergies   Ciprofloxacin hcl   Review of Systems Review of Systems  Musculoskeletal: Positive for back pain.  All other systems reviewed and are negative.    Physical Exam Updated Vital Signs BP 115/77   Pulse (!) 56   Temp 97.9 F (36.6 C) (Oral)   Ht 6' (1.829 m)   Wt 70.3 kg (155 lb)   SpO2 99%   BMI 21.02 kg/m   Physical Exam  Constitutional: He is oriented to person, place, and time. He appears well-developed and well-nourished. No distress.  HENT:  Head: Normocephalic and atraumatic.  Eyes: Conjunctivae and EOM are normal. Right eye exhibits no discharge. Left eye exhibits no discharge. No scleral icterus.  Neck: Normal range of motion. Neck supple.  Cardiovascular: Normal rate, regular rhythm, normal heart sounds and intact distal pulses.   Pulmonary/Chest: Effort normal and breath sounds normal. No respiratory distress. He has no wheezes. He has no rales. He exhibits no tenderness.  Abdominal: Soft. He exhibits no distension and no mass. There is no tenderness. There  is no rebound and no guarding.  Musculoskeletal: Normal range of motion. He exhibits tenderness. He exhibits no edema or deformity.  No midline C, T, or L tenderness. Mild TTP over bilateral lumbar paraspinal muscles. Full range of motion of neck and back. Full range of motion of bilateral upper and lower extremities, with 5/5 strength. Sensation intact. 2+ radial and PT pulses. Cap refill <2 seconds. Patient able to stand and ambulate without assistance.    Neurological: He is alert and oriented to person, place, and time. He displays normal reflexes. No sensory deficit.  Skin: Skin  is warm and dry. He is not diaphoretic.  Nursing note and vitals reviewed.    ED Treatments / Results  Labs (all labs ordered are listed, but only abnormal results are displayed) Labs Reviewed - No data to display  EKG  EKG Interpretation None       Radiology No results found.  Procedures Procedures (including critical care time)  Medications Ordered in ED Medications - No data to display   Initial Impression / Assessment and Plan / ED Course  I have reviewed the triage vital signs and the nursing notes.  Pertinent labs & imaging results that were available during my care of the patient were reviewed by me and considered in my medical decision making (see chart for details).     Patient with back pain.  No neurological deficits and normal neuro exam.  Patient can walk but states is painful.  No loss of bowel or bladder control.  No concern for cauda equina.  No fever, night sweats, weight loss, h/o cancer, IVDU.  RICE protocol and pain medicine indicated and discussed with patient.  Discussed return precautions.   Final Clinical Impressions(s) / ED Diagnoses   Final diagnoses:  Acute bilateral low back pain without sciatica    New Prescriptions New Prescriptions   METHOCARBAMOL (ROBAXIN) 500 MG TABLET    Take 1 tablet (500 mg total) by mouth 2 (two) times daily.   NAPROXEN (NAPROSYN) 500 MG TABLET    Take 1 tablet (500 mg total) by mouth 2 (two) times daily.     Barrett Henle, PA-C 11/18/16 7829    Derwood Kaplan, MD 11/18/16 [phone]

## 2016-11-18 NOTE — ED Triage Notes (Signed)
Reports pain in back that goes from neck down to lower back along spine.  Denies any injury.  Been going on for a week.  Denies any urinary issues or change in sensation.

## 2017-01-13 ENCOUNTER — Ambulatory Visit (HOSPITAL_COMMUNITY)
Admission: EM | Admit: 2017-01-13 | Discharge: 2017-01-13 | Disposition: A | Discharge status: Home / Self Care | Payer: Medicare Other | Attending: Family Medicine | Admitting: Family Medicine

## 2017-01-13 ENCOUNTER — Encounter (HOSPITAL_COMMUNITY): Payer: Self-pay | Admitting: Emergency Medicine

## 2017-01-13 DIAGNOSIS — G44209 Tension-type headache, unspecified, not intractable: Secondary | ICD-10-CM | POA: Diagnosis not present

## 2017-01-13 MED ORDER — KETOROLAC TROMETHAMINE 60 MG/2ML IM SOLN
60.0000 mg | Freq: Once | INTRAMUSCULAR | Status: AC
  Administered 2017-01-13: 60 mg via INTRAMUSCULAR

## 2017-01-13 MED ORDER — KETOROLAC TROMETHAMINE 60 MG/2ML IM SOLN
INTRAMUSCULAR | Status: AC
  Filled 2017-01-13: qty 2

## 2017-01-13 NOTE — ED Provider Notes (Addendum)
  [facility]   [id number] 01/13/17 Arrival Time: 1302  ASSESSMENT & PLAN:  1. Tension headache     Meds ordered this encounter  Medications  . ketorolac (TORADOL) injection 60 mg   Later this evening and tomorrow may use OTC ibuprofen 800mg  TID with food. F/U if not showing improvement or worsening.  Reviewed expectations re: course of current medical issues. Questions answered. Outlined signs and symptoms indicating need for more acute intervention. Patient verbalized understanding. After Visit Summary given.   SUBJECTIVE:  Ronald KearnsRicardo Stein is a 36 y.o. male who presents with complaint of a headache for 2-3 days. Off/on. Gradual onset. No h/o severe headaches. Started in neck and now feels like "scalp is throbbing". No n/v. Normal PO intake. No recent illnesses. Ambulatory without difficulty. No extremity sensation changes or weakness. No specific aggravating or alleviating factors reported. Single dose of ibuprofen 200mg  without much relief. Occasional light sensitivity.  ROS: As per HPI. All other systems negative.   OBJECTIVE:  Vitals:   01/13/17 1340 01/13/17 1341  BP: 124/75   Pulse: 61   Resp: 16   Temp: 98.3 F (36.8 C)   TempSrc: Oral   SpO2: 98%   Weight:  160 lb (72.6 kg)  Height:  6' (1.829 m)     General appearance: alert; no distress HEENT: normocephalic; atraumatic; conjunctivae normal; TMs normal; nasal mucosa normal; oral mucosa normal Neck: supple; mild tenderness over musculature Lungs: clear to auscultation bilaterally Heart: regular rate and rhythm Extremities: no cyanosis or edema; symmetrical with no gross deformities Skin: warm and dry Neurologic: normal symmetric reflexes; normal gait Psychological:  alert and cooperative; normal mood and affect   Allergies  Allergen Reactions  . Ciprofloxacin Hcl Swelling    PMHx, SurgHx, SocialHx, Medications, and Allergies were reviewed in the Visit Navigator and updated as  appropriate.      Mardella LaymanHagler, Orian Figueira, MD 01/13/17 1422    Mardella LaymanHagler, Marceline Napierala, MD 01/13/17 [phone]

## 2017-01-13 NOTE — ED Triage Notes (Signed)
PT reports a headache in back of head with pain that goes into neck and right shoulder. PT has no known injury. PT reports dizziness and light sensitivity with headaches.

## 2017-01-16 ENCOUNTER — Emergency Department (HOSPITAL_COMMUNITY)
Admission: EM | Admit: 2017-01-16 | Discharge: 2017-01-16 | Disposition: A | Discharge status: Home / Self Care | Payer: Medicare Other | Attending: Emergency Medicine | Admitting: Emergency Medicine

## 2017-01-16 ENCOUNTER — Encounter (HOSPITAL_COMMUNITY): Payer: Self-pay | Admitting: Emergency Medicine

## 2017-01-16 ENCOUNTER — Emergency Department (HOSPITAL_COMMUNITY): Payer: Medicare Other

## 2017-01-16 DIAGNOSIS — G44219 Episodic tension-type headache, not intractable: Secondary | ICD-10-CM | POA: Diagnosis not present

## 2017-01-16 DIAGNOSIS — Z79899 Other long term (current) drug therapy: Secondary | ICD-10-CM | POA: Insufficient documentation

## 2017-01-16 DIAGNOSIS — R51 Headache: Secondary | ICD-10-CM | POA: Diagnosis not present

## 2017-01-16 DIAGNOSIS — F17210 Nicotine dependence, cigarettes, uncomplicated: Secondary | ICD-10-CM | POA: Insufficient documentation

## 2017-01-16 MED ORDER — DIPHENHYDRAMINE HCL 25 MG PO CAPS
25.0000 mg | ORAL_CAPSULE | Freq: Once | ORAL | Status: AC
  Administered 2017-01-16: 25 mg via ORAL
  Filled 2017-01-16: qty 1

## 2017-01-16 MED ORDER — KETOROLAC TROMETHAMINE 30 MG/ML IJ SOLN
30.0000 mg | Freq: Once | INTRAMUSCULAR | Status: AC
  Administered 2017-01-16: 30 mg via INTRAMUSCULAR
  Filled 2017-01-16: qty 1

## 2017-01-16 MED ORDER — METOCLOPRAMIDE HCL 10 MG PO TABS
5.0000 mg | ORAL_TABLET | Freq: Once | ORAL | Status: AC
  Administered 2017-01-16: 5 mg via ORAL
  Filled 2017-01-16: qty 1

## 2017-01-16 MED ORDER — METOCLOPRAMIDE HCL 5 MG/ML IJ SOLN
5.0000 mg | Freq: Once | INTRAMUSCULAR | Status: DC
Start: 2017-01-16 — End: 2017-01-16

## 2017-01-16 MED ORDER — BACLOFEN 10 MG PO TABS: 10.0000 mg | ORAL_TABLET | Freq: Three times a day (TID) | ORAL | 0 refills | Status: DC

## 2017-01-16 NOTE — ED Provider Notes (Signed)
[facility]   CSN: [id number] Arrival date & time: 01/16/17  0825     History   Chief Complaint Chief Complaint  Patient presents with  . Headache    HPI Ronald Stein is a 36 y.o. male.  HPI   36 year old male presents today with complaints of headache.  Patient notes posterior headache for the last week.  He notes this feels like pressure radiating down into the left paracervical musculature.  Patient notes symptoms are worse with palpation of the left lateral neck musculature and posterior head.  He denies any head trauma, fever, chills, neck stiffness, or any neurological deficits.  Patient reports he was seen at urgent care and diagnosed with tension headache, he was given Toradol which improved his symptoms.  He notes the symptoms return the following day.  Patient denies any significant past medical history of headaches, but notes he has been under significant stress lately.  Patient denies any drug or alcohol, he reports symptoms are made worse with light and sound.  No medications prior to arrival.    Past Medical History:  Diagnosis Date  . Bipolar 1 disorder (HCC)   . Schizophrenia [facility])     Patient Active Problem List   Diagnosis Date Noted  . Schizophrenia, unspecified type (HCC)     History reviewed. No pertinent surgical history.     Home Medications    Prior to Admission medications   Medication Sig Start Date End Date Taking? Authorizing Provider  baclofen (LIORESAL) 10 MG tablet Take 1 tablet (10 mg total) by mouth 3 (three) times daily. 01/16/17   Jefry Lesinski, Tinnie Gens, PA-C  dextromethorphan (DELSYM) 30 MG/5ML liquid Take 30 mg by mouth 3 (three) times daily as needed for cough.    [provider]  methocarbamol (ROBAXIN) 500 MG tablet Take 1 tablet (500 mg total) by mouth 2 (two) times daily. 11/18/16   Barrett Henle, PA-C  naproxen (NAPROSYN) 500 MG tablet Take 1 tablet (500 mg total) by mouth 2 (two) times daily.  11/18/16   Barrett Henle, PA-C  QUEtiapine Fumarate (SEROQUEL PO) Take 400 mg by mouth at bedtime.    [provider]  sertraline (ZOLOFT) 100 MG tablet Take 100 mg by mouth daily.    [provider]    Family History No family history on file.  Social History Social History  Substance Use Topics  . Smoking status: Current Every Day Smoker    Packs/day: 1.00    Types: Cigarettes  . Smokeless tobacco: Never Used  . Alcohol use No     Comment: occasionally     Allergies   Ciprofloxacin hcl   Review of Systems Review of Systems  All other systems reviewed and are negative.    Physical Exam Updated Vital Signs BP 108/77   Pulse (!) 47   Temp 97.8 F (36.6 C) (Oral)   Resp 17   Ht 6' (1.829 m)   Wt 73.5 kg (162 lb)   SpO2 100%   BMI 21.97 kg/m   Physical Exam  Constitutional: He is oriented to person, place, and time. He appears well-developed and well-nourished. No distress.  HENT:  Head: Normocephalic.  Eyes: Pupils are equal, round, and reactive to light. Conjunctivae and EOM are normal. Right eye exhibits no discharge. Left eye exhibits no discharge. No scleral icterus.  Neck: Normal range of motion. Neck supple. No JVD present.  Pulmonary/Chest: No stridor.  Musculoskeletal: Normal range of motion. He exhibits no edema or  tenderness.  Tenderness palpation of the left lateral cervical musculature  Lymphadenopathy:    He has no cervical adenopathy.  Neurological: He is alert and oriented to person, place, and time. He has normal strength. He displays no atrophy and no tremor. No cranial nerve deficit or sensory deficit. He exhibits normal muscle tone. He displays a negative Romberg sign. He displays no seizure activity. Coordination and gait normal. GCS eye subscore is 4. GCS verbal subscore is 5. GCS motor subscore is 6.  Reflex Scores:      Patellar reflexes are 2+ on the right side and 2+ on the left side. Skin: He is not  diaphoretic.  Nursing note and vitals reviewed.    ED Treatments / Results  Labs (all labs ordered are listed, but only abnormal results are displayed) Labs Reviewed - No data to display  EKG  EKG Interpretation None       Radiology Ct Head Wo Contrast  Result Date: 01/16/2017 CLINICAL DATA:  36 year old male with a history of headache EXAM: CT HEAD WITHOUT CONTRAST TECHNIQUE: Contiguous axial images were obtained from the base of the skull through the vertex without intravenous contrast. COMPARISON:  06/29/2014 FINDINGS: Brain: No acute intracranial hemorrhage. No midline shift or mass effect. Gray-white differentiation maintained. Unremarkable appearance of the ventricular system. Vascular: Unremarkable. Skull: No acute fracture.  No aggressive bone lesion identified. Sinuses/Orbits: Unremarkable appearance of the orbits. Mastoid air cells clear. No middle ear effusion. Trace left-sided maxillary sinus disease. Other: None IMPRESSION: No CT evidence of acute intracranial abnormality. Electronically Signed   By: Gilmer Mor D.O.   On: 01/16/2017 11:30    Procedures Procedures (including critical care time)  Medications Ordered in ED Medications  ketorolac (TORADOL) 30 MG/ML injection 30 mg (30 mg Intramuscular Given 01/16/17 1045)  diphenhydrAMINE (BENADRYL) capsule 25 mg (25 mg Oral Given 01/16/17 1044)  metoCLOPramide (REGLAN) tablet 5 mg (5 mg Oral Given 01/16/17 1044)     Initial Impression / Assessment and Plan / ED Course  I have reviewed the triage vital signs and the nursing notes.  Pertinent labs & imaging results that were available during my care of the patient were reviewed by me and considered in my medical decision making (see chart for details).      Final Clinical Impressions(s) / ED Diagnoses   Final diagnoses:  Episodic tension-type headache, not intractable   Labs:   Imaging: CT head without  Consults:  Therapeutics: Toradol Reglan and  Benadryl  Discharge Meds: Baclofen  Assessment/Plan: Patient presents with headache today.  This is likely tension headache.  He has no red flags, normal CT. symptoms resolved here with above medications.  Discharged home on muscle relaxer, Tylenol ibuprofen with neurology follow-up.  Strict return precautions given.  He verbalized understanding and agreement to today's plan had no further questions or concerns at time discharge      New Prescriptions New Prescriptions   BACLOFEN (LIORESAL) 10 MG TABLET    Take 1 tablet (10 mg total) by mouth 3 (three) times daily.     Eyvonne Mechanic, PA-C 01/16/17 1147    Arby Barrette, MD 01/25/17 [phone]

## 2017-01-16 NOTE — ED Notes (Signed)
Patient transported to CT 

## 2017-01-16 NOTE — ED Notes (Signed)
Discharge signature not working. Discharge explained to patient and patient reports understanding

## 2017-01-16 NOTE — ED Triage Notes (Signed)
Pt states "I came here on the 16th for headaches and they gave me a shot but it hasn't gotten any better". Pt c/o headache.

## 2017-01-16 NOTE — Discharge Instructions (Signed)
Please read attached information. If you experience any new or worsening signs or symptoms please return to the emergency room for evaluation. Please follow-up with your primary care provider or specialist as discussed. Please use medication prescribed only as directed and discontinue taking if you have any concerning signs or symptoms.   °

## 2017-04-02 DIAGNOSIS — H04123 Dry eye syndrome of bilateral lacrimal glands: Secondary | ICD-10-CM | POA: Diagnosis not present

## 2017-04-02 DIAGNOSIS — H40033 Anatomical narrow angle, bilateral: Secondary | ICD-10-CM | POA: Diagnosis not present

## 2017-04-29 ENCOUNTER — Ambulatory Visit (INDEPENDENT_AMBULATORY_CARE_PROVIDER_SITE_OTHER): Payer: Medicare Other | Admitting: Family Medicine

## 2017-04-29 ENCOUNTER — Encounter: Payer: Self-pay | Admitting: Family Medicine

## 2017-04-29 ENCOUNTER — Other Ambulatory Visit (HOSPITAL_COMMUNITY)
Admission: RE | Admit: 2017-04-29 | Discharge: 2017-04-29 | Disposition: A | Discharge status: Home / Self Care | Payer: Medicare Other | Source: Ambulatory Visit | Attending: Family Medicine | Admitting: Family Medicine

## 2017-04-29 VITALS — BP 140/68 | HR 66 | Temp 98.0°F | Resp 16 | Ht 72.0 in | Wt 167.0 lb

## 2017-04-29 DIAGNOSIS — Z1329 Encounter for screening for other suspected endocrine disorder: Secondary | ICD-10-CM

## 2017-04-29 DIAGNOSIS — Z113 Encounter for screening for infections with a predominantly sexual mode of transmission: Secondary | ICD-10-CM

## 2017-04-29 DIAGNOSIS — Z114 Encounter for screening for human immunodeficiency virus [HIV]: Secondary | ICD-10-CM | POA: Diagnosis not present

## 2017-04-29 DIAGNOSIS — Z23 Encounter for immunization: Secondary | ICD-10-CM

## 2017-04-29 DIAGNOSIS — Z130 Encounter for screening for diseases of the blood and blood-forming organs and certain disorders involving the immune mechanism: Secondary | ICD-10-CM

## 2017-04-29 DIAGNOSIS — Z131 Encounter for screening for diabetes mellitus: Secondary | ICD-10-CM | POA: Diagnosis not present

## 2017-04-29 MED ORDER — CETIRIZINE HCL 10 MG PO TABS: 10.0000 mg | ORAL_TABLET | Freq: Every day | ORAL | 11 refills | Status: AC

## 2017-04-29 MED ORDER — ALBUTEROL SULFATE HFA 108 (90 BASE) MCG/ACT IN AERS: 2.0000 | INHALATION_SPRAY | Freq: Four times a day (QID) | RESPIRATORY_TRACT | 0 refills | Status: AC | PRN

## 2017-04-29 MED ORDER — AMOXICILLIN-POT CLAVULANATE 875-125 MG PO TABS: 1.0000 | ORAL_TABLET | Freq: Two times a day (BID) | ORAL | 0 refills | Status: DC

## 2017-04-29 MED ORDER — MONTELUKAST SODIUM 10 MG PO TABS: 10.0000 mg | ORAL_TABLET | Freq: Every day | ORAL | 3 refills | Status: DC

## 2017-04-29 NOTE — Progress Notes (Signed)
Ronald Stein, is a 36 y.o. male  ZOX:[id number]  WJX:[id number]  DOB - 1980/10/13  CC:  Chief Complaint  Patient presents with  . Establish Care      HPI: Ronald Stein is a 36 y.o. male is here to establish care today. Medical history is significant for substance abuse, current every day smoker, asthma, deviated septum, depression, anxiety, and schizophrenia. Current concern is ongoing upper respiratory symptoms and STI testing. Social history include every day smoker of cigarettes (average 10 per day), occasional marijuana use, and occasional alcohol use. History include an impatient behavioral health admission related to cocaine abuse and schizophrenia. He has been absence of cocaine use for more than 1 year and discontinued his mental health medications shortly after discontinuing cocaine use. He endorses occasional marijuana use. Doc denies any thoughts of suicide or harming others. Ronald Stein reports a history of asthma and has recently experienced on-going symptoms of cough, chest congestion, mild wheezing, headache, and nasal congestion. He has attempted relief with otc NSAID without relief of symptoms. Denies chest pain, abdominal pain, nausea, and no new weakness tingling or numbness. Patient requests STI testing today. Reports inconsistent use of condoms and he is sexually active. Allergies  Allergen Reactions  . Ciprofloxacin Hcl Swelling   Past Medical History:  Diagnosis Date  . Bipolar 1 disorder (HCC)   . Schizophrenia [facility])    Current Outpatient Medications on File Prior to Visit  Medication Sig Dispense Refill  . baclofen (LIORESAL) 10 MG tablet Take 1 tablet (10 mg total) by mouth 3 (three) times daily. (Patient not taking: Reported on 04/29/2017) 30 each 0  . dextromethorphan (DELSYM) 30 MG/5ML liquid Take 30 mg by mouth 3 (three) times daily as needed for cough.    . methocarbamol (ROBAXIN) 500 MG tablet Take 1 tablet (500 mg total) by mouth 2 (two) times daily.  (Patient not taking: Reported on 04/29/2017) 20 tablet 0  . naproxen (NAPROSYN) 500 MG tablet Take 1 tablet (500 mg total) by mouth 2 (two) times daily. (Patient not taking: Reported on 04/29/2017) 30 tablet 0  . QUEtiapine Fumarate (SEROQUEL PO) Take 400 mg by mouth at bedtime.    . sertraline (ZOLOFT) 100 MG tablet Take 100 mg by mouth daily.     No current facility-administered medications on file prior to visit.    Family History  Problem Relation Age of Onset  . Hypertension Mother    Social History   Socioeconomic History  . Marital status: Single    Spouse name: Not on file  . Number of children: Not on file  . Years of education: Not on file  . Highest education level: Not on file  Social Needs  . Financial resource strain: Not on file  . Food insecurity - worry: Not on file  . Food insecurity - inability: Not on file  . Transportation needs - medical: Not on file  . Transportation needs - non-medical: Not on file  Occupational History  . Not on file  Tobacco Use  . Smoking status: Current Every Day Smoker    Packs/day: 1.00    Types: Cigarettes  . Smokeless tobacco: Never Used  Substance and Sexual Activity  . Alcohol use: Yes    Comment: occasionally  . Drug use: Yes    Types: Marijuana  . Sexual activity: Not on file  Other Topics Concern  . Not on file  Social History Narrative  . Not on file    Review of Systems: Constitutional: Negative for fever,  chills, diaphoresis, activity change, appetite change and fatigue. HENT: Negative for ear pain, nosebleeds, congestion, facial swelling, rhinorrhea, neck pain, neck stiffness and ear discharge.  Eyes: Negative for pain, discharge, redness, itching and visual disturbance. Respiratory: Negative for cough, choking, chest tightness, shortness of breath, wheezing and stridor.  Cardiovascular: Negative for chest pain, palpitations and leg swelling. Gastrointestinal: Negative for abdominal distention. Genitourinary:  Negative for dysuria, urgency, frequency, hematuria, flank pain, decreased urine volume, difficulty urinating and dyspareunia.  Musculoskeletal: Negative for back pain, joint swelling, arthralgia and gait problem. Neurological: Negative for dizziness, tremors, seizures, syncope, facial asymmetry, speech difficulty, weakness, light-headedness, numbness and headaches.  Hematological: Negative for adenopathy. Does not bruise/bleed easily. Psychiatric/Behavioral: Negative for hallucinations, behavioral problems, confusion, dysphoric mood, decreased concentration and agitation.    Objective:   Vitals:   04/29/17 0808  BP: 140/68  Pulse: 66  Resp: 16  Temp: 98 F (36.7 C)  SpO2: 100%    Physical Exam: Constitutional: Patient appears well-developed and well-nourished. No distress. HENT: Normocephalic, atraumatic, External right and left ear normal. Oropharynx is clear and moist.  Eyes: Conjunctivae and EOM are normal. PERRLA, no scleral icterus. Neck: Normal ROM. Neck supple. No JVD. No tracheal deviation. No thyromegaly. CVS: RRR, S1/S2 +, no murmurs, no gallops, no carotid bruit.  Pulmonary: Effort and breath sounds normal, no stridor, rhonchi, wheezes, rales.  Abdominal: Soft. BS +, no distension, tenderness, rebound or guarding.  Musculoskeletal: Normal range of motion. No edema and no tenderness.  Lymphadenopathy: No lymphadenopathy noted, cervical, inguinal or axillary Neuro: Alert. Normal reflexes, muscle tone coordination. No cranial nerve deficit. Skin: Skin is warm and dry. No rash noted. Not diaphoretic. No erythema. No pallor. Psychiatric: Normal mood and affect. Behavior, judgment, thought content normal.  Lab Results  Component Value Date   WBC 7.6 08/15/2014   HGB 12.3 (L) 08/15/2014   HCT 35.6 (L) 08/15/2014   MCV 94.4 08/15/2014   PLT 205 08/15/2014   Lab Results  Component Value Date   CREATININE 1.21 08/15/2014   BUN 10 08/15/2014   NA 136 08/15/2014   K 3.4  (L) 08/15/2014   CL 101 08/15/2014   CO2 23 08/15/2014    No results found for: HGBA1C Lipid Panel  No results found for: CHOL, TRIG, HDL, CHOLHDL, VLDL, LDLCALC      Assessment and plan:  1. Need for immunization against influenza- Flu Vaccine QUAD 36+ mos IM  2. Routine screening for STI (sexually transmitted infection), patient is sexually active and inconsistently uses barrier protection. Screening for syphilis, GC/Chylamdia, trichomonas, and HIV.  3. Screening for thyroid disorder, checking Thyroid Panel With TSH  4. Screening for deficiency anemia, -checking a  CBC with Differential  5. Screening for diabetes mellitus- checking CMP for glucose, renal function, and liver function.  6. Encounter for screening for HIV, will check  HIV antibody (with reflex), patient is sexually active and inconsistently uses barrier protection.  7. Need for Tdap vaccination- Tdap vaccine greater than or equal to 7yo IM    Meds ordered this encounter  Medications  . albuterol (PROVENTIL HFA;VENTOLIN HFA) 108 (90 Base) MCG/ACT inhaler    Sig: Inhale 2 puffs into the lungs every 6 (six) hours as needed for wheezing or shortness of breath.    Dispense:  1 Inhaler    Refill:  0    Order Specific Question:   Supervising Provider    Answer:   Quentin AngstJEGEDE, OLUGBEMIGA E [id number]]  . cetirizine (ZYRTEC) 10 MG tablet  Sig: Take 1 tablet (10 mg total) by mouth daily.    Dispense:  30 tablet    Refill:  11    Order Specific Question:   Supervising Provider    Answer:   Quentin AngstJEGEDE, OLUGBEMIGA E [id number]]  . montelukast (SINGULAIR) 10 MG tablet    Sig: Take 1 tablet (10 mg total) by mouth at bedtime.    Dispense:  30 tablet    Refill:  3    Order Specific Question:   Supervising Provider    Answer:   Quentin AngstJEGEDE, OLUGBEMIGA E [id number]]  . amoxicillin-clavulanate (AUGMENTIN) 875-125 MG tablet    Sig: Take 1 tablet by mouth 2 (two) times daily.    Dispense:  20 tablet    Refill:  0    Order Specific  Question:   Supervising Provider    Answer:   Quentin AngstJEGEDE, OLUGBEMIGA E [id number]]    Orders Placed This Encounter  Procedures  . Flu Vaccine QUAD 36+ mos IM  . Tdap vaccine greater than or equal to 7yo IM  . CBC with Differential  . COMPLETE METABOLIC PANEL WITH GFR  . Thyroid Panel With TSH  . RPR  . HIV antibody (with reflex)     Return in about 6 months (around 10/27/2017).  The patient was given clear instructions to go to ER or return to medical center if symptoms don't improve, worsen or new problems develop. The patient verbalized understanding. The patient was told to call to get lab results if they haven't heard anything in the next week.     Godfrey PickKimberly S. Tiburcio PeaHarris, MSN, FNP-C [facility]  [address] [place], [place] [address] [phone]  This note has been created with Dragon speech recognition software and smart phrase technology. Any transcriptional errors are unintentional.

## 2017-05-02 LAB — URINE CYTOLOGY ANCILLARY ONLY
CHLAMYDIA, DNA PROBE: NEGATIVE
Neisseria Gonorrhea: NEGATIVE
Trichomonas: NEGATIVE

## 2017-05-02 LAB — CBC WITH DIFFERENTIAL/PLATELET
Basophils Absolute: 52 cells/uL (ref 0–200)
Basophils Relative: 0.9 %
EOS PCT: 1.4 %
Eosinophils Absolute: 81 cells/uL (ref 15–500)
HEMATOCRIT: 43 % (ref 38.5–50.0)
HEMOGLOBIN: 14.9 g/dL (ref 13.2–17.1)
LYMPHS ABS: 2494 {cells}/uL (ref 850–3900)
MCH: 32.7 pg (ref 27.0–33.0)
MCHC: 34.7 g/dL (ref 32.0–36.0)
MCV: 94.3 fL (ref 80.0–100.0)
MPV: 9.9 fL (ref 7.5–12.5)
Monocytes Relative: 7 %
NEUTROS ABS: 2767 {cells}/uL (ref 1500–7800)
NEUTROS PCT: 47.7 %
Platelets: 239 10*3/uL (ref 140–400)
RBC: 4.56 10*6/uL (ref 4.20–5.80)
RDW: 12 % (ref 11.0–15.0)
Total Lymphocyte: 43 %
WBC: 5.8 10*3/uL (ref 3.8–10.8)
WBCMIX: 406 {cells}/uL (ref 200–950)

## 2017-05-02 LAB — COMPLETE METABOLIC PANEL WITH GFR
AG RATIO: 1.6 (calc) (ref 1.0–2.5)
ALBUMIN MSPROF: 4.5 g/dL (ref 3.6–5.1)
ALT: 13 U/L (ref 9–46)
AST: 19 U/L (ref 10–40)
Alkaline phosphatase (APISO): 63 U/L (ref 40–115)
BUN: 13 mg/dL (ref 7–25)
CALCIUM: 10 mg/dL (ref 8.6–10.3)
CO2: 30 mmol/L (ref 20–32)
CREATININE: 1.18 mg/dL (ref 0.60–1.35)
Chloride: 103 mmol/L (ref 98–110)
GFR, EST AFRICAN AMERICAN: 92 mL/min/{1.73_m2} (ref >=60)
GFR, EST NON AFRICAN AMERICAN: 79 mL/min/{1.73_m2} (ref >=60)
GLOBULIN: 2.9 g/dL (ref 1.9–3.7)
Glucose, Bld: 88 mg/dL (ref 65–99)
POTASSIUM: 4.7 mmol/L (ref 3.5–5.3)
SODIUM: 140 mmol/L (ref 135–146)
TOTAL PROTEIN: 7.4 g/dL (ref 6.1–8.1)
Total Bilirubin: 0.4 mg/dL (ref 0.2–1.2)

## 2017-05-02 LAB — THYROID PANEL WITH TSH
Free Thyroxine Index: 2.3 (ref 1.4–3.8)
T3 Uptake: 35 % (ref 22–35)
T4 TOTAL: 6.6 ug/dL (ref 4.9–10.5)
TSH: 0.76 mIU/L (ref 0.40–4.50)

## 2017-05-02 LAB — HIV ANTIBODY (ROUTINE TESTING W REFLEX): HIV 1&2 Ab, 4th Generation: NONREACTIVE

## 2017-05-02 LAB — RPR: RPR Ser Ql: NONREACTIVE

## 2017-05-03 ENCOUNTER — Encounter: Payer: Self-pay | Admitting: Family Medicine

## 2017-05-03 NOTE — Progress Notes (Signed)
Mail letter. 

## 2017-05-04 LAB — URINE CYTOLOGY ANCILLARY ONLY
Bacterial vaginitis: NEGATIVE
Candida vaginitis: NEGATIVE

## 2017-06-12 ENCOUNTER — Telehealth: Payer: Self-pay | Admitting: *Deleted

## 2017-06-12 NOTE — Telephone Encounter (Signed)
Called and spoke with patient. He requested to r/s his appt for 06/13/17 and I informed him that I was actually calling to r/s due to weather. He reported he was stable and ok to be seen on Thursday 06/30/17 @ 11:15 with an arrival time of 10:45 for paperwork. He verbalized appreciation for the call and his questions were answered. Appt for 1/14 cancelled.

## 2017-06-13 ENCOUNTER — Ambulatory Visit: Payer: Medicare Other | Admitting: Neurology

## 2017-06-19 ENCOUNTER — Other Ambulatory Visit: Payer: Self-pay

## 2017-06-19 ENCOUNTER — Emergency Department (HOSPITAL_COMMUNITY)
Admission: EM | Admit: 2017-06-19 | Discharge: 2017-06-19 | Disposition: A | Discharge status: Home / Self Care | Payer: Medicare Other | Attending: Emergency Medicine | Admitting: Emergency Medicine

## 2017-06-19 ENCOUNTER — Encounter (HOSPITAL_COMMUNITY): Payer: Self-pay | Admitting: *Deleted

## 2017-06-19 ENCOUNTER — Emergency Department (HOSPITAL_COMMUNITY): Payer: Medicare Other

## 2017-06-19 DIAGNOSIS — W1789XD Other fall from one level to another, subsequent encounter: Secondary | ICD-10-CM | POA: Insufficient documentation

## 2017-06-19 DIAGNOSIS — S34139A Unspecified injury to sacral spinal cord, initial encounter: Secondary | ICD-10-CM | POA: Diagnosis not present

## 2017-06-19 DIAGNOSIS — F17210 Nicotine dependence, cigarettes, uncomplicated: Secondary | ICD-10-CM | POA: Insufficient documentation

## 2017-06-19 DIAGNOSIS — M25562 Pain in left knee: Secondary | ICD-10-CM | POA: Diagnosis not present

## 2017-06-19 DIAGNOSIS — Z79899 Other long term (current) drug therapy: Secondary | ICD-10-CM | POA: Insufficient documentation

## 2017-06-19 DIAGNOSIS — M545 Low back pain, unspecified: Secondary | ICD-10-CM

## 2017-06-19 DIAGNOSIS — M533 Sacrococcygeal disorders, not elsewhere classified: Secondary | ICD-10-CM | POA: Diagnosis not present

## 2017-06-19 DIAGNOSIS — G8929 Other chronic pain: Secondary | ICD-10-CM | POA: Diagnosis not present

## 2017-06-19 NOTE — Discharge Instructions (Signed)
Please read attached information. If you experience any new or worsening signs or symptoms please return to the emergency room for evaluation. Please follow-up with your primary care provider or specialist as discussed. Please use tylenol or ibuprofen as needed for pain.  °

## 2017-06-19 NOTE — ED Provider Notes (Signed)
[facility] Provider Note   CSN: [id number] Arrival date & time: 06/19/17  0702  History   Chief Complaint Chief Complaint  Patient presents with  . Back Pain  . Knee Pain    HPI Ronald Stein is a 37 y.o. male.  HPI   37 year old male presents today with complaints of chronic back and knee pain.  Reports that several years ago he jumped off a roof and landed on his feet.  He notes pain in the sacrum and lower lumbar region and also pain in his left knee.  He notes the knee pain started before that after running track.  He denies any recent injuries, denies any fever chills nausea vomiting, swelling edema, abdominal pain, or neurological deficits.  Patient reports taking ibuprofen which mildly improved symptoms.  He has not been evaluated for this in the past.    Past Medical History:  Diagnosis Date  . Bipolar 1 disorder (HCC)   . Schizophrenia [facility](HCC)     Patient Active Problem List   Diagnosis Date Noted  . Schizophrenia, unspecified type (HCC)     History reviewed. No pertinent surgical history.     Home Medications    Prior to Admission medications   Medication Sig Start Date End Date Taking? Authorizing Provider  albuterol (PROVENTIL HFA;VENTOLIN HFA) 108 (90 Base) MCG/ACT inhaler Inhale 2 puffs into the lungs every 6 (six) hours as needed for wheezing or shortness of breath. 04/29/17   Bing NeighborsHarris, Kimberly S, FNP  amoxicillin-clavulanate (AUGMENTIN) 875-125 MG tablet Take 1 tablet by mouth 2 (two) times daily. 04/29/17   Bing NeighborsHarris, Kimberly S, FNP  baclofen (LIORESAL) 10 MG tablet Take 1 tablet (10 mg total) by mouth 3 (three) times daily. Patient not taking: Reported on 04/29/2017 01/16/17   Amna Welker, Tinnie GensJeffrey, PA-C  cetirizine (ZYRTEC) 10 MG tablet Take 1 tablet (10 mg total) by mouth daily. 04/29/17   Bing NeighborsHarris, Kimberly S, FNP  dextromethorphan (DELSYM) 30 MG/5ML liquid Take 30 mg by mouth 3 (three) times daily as needed for cough.     [provider]  methocarbamol (ROBAXIN) 500 MG tablet Take 1 tablet (500 mg total) by mouth 2 (two) times daily. Patient not taking: Reported on 04/29/2017 11/18/16   Barrett HenleNadeau, Nicole Elizabeth, PA-C  montelukast (SINGULAIR) 10 MG tablet Take 1 tablet (10 mg total) by mouth at bedtime. 04/29/17   Bing NeighborsHarris, Kimberly S, FNP  naproxen (NAPROSYN) 500 MG tablet Take 1 tablet (500 mg total) by mouth 2 (two) times daily. Patient not taking: Reported on 04/29/2017 11/18/16   Barrett HenleNadeau, Nicole Elizabeth, PA-C  QUEtiapine Fumarate (SEROQUEL PO) Take 400 mg by mouth at bedtime.    [provider]  sertraline (ZOLOFT) 100 MG tablet Take 100 mg by mouth daily.    [provider]    Family History Family History  Problem Relation Age of Onset  . Hypertension Mother     Social History Social History   Tobacco Use  . Smoking status: Current Every Day Smoker    Packs/day: 1.00    Types: Cigarettes  . Smokeless tobacco: Never Used  Substance Use Topics  . Alcohol use: Yes    Comment: occasionally  . Drug use: Yes    Types: Marijuana     Allergies   Ciprofloxacin hcl and Wool alcohol [lanolin]   Review of Systems Review of Systems  All other systems reviewed and are negative.    Physical Exam Updated Vital Signs BP 118/70 (BP Location: Right Arm)  Pulse (!) 57   Temp 97.9 F (36.6 C) (Oral)   Resp 16   SpO2 100%   Physical Exam  Constitutional: He is oriented to person, place, and time. He appears well-developed and well-nourished.  HENT:  Head: Normocephalic and atraumatic.  Eyes: Conjunctivae are normal. Pupils are equal, round, and reactive to light. Right eye exhibits no discharge. Left eye exhibits no discharge. No scleral icterus.  Neck: Normal range of motion. No JVD present. No tracheal deviation present.  Pulmonary/Chest: Effort normal. No stridor.  Musculoskeletal:  No CT or L-spine tenderness to palpation.  Minor tenderness palpation of the  sacrum, no swelling or edema.  Bilateral lower sensation strength and motor function intact.  No pain with palpation of the left knee, no swelling or edema, full active range of motion no laxity noted.  Neurological: He is alert and oriented to person, place, and time. Coordination normal.  Psychiatric: He has a normal mood and affect. His behavior is normal. Judgment and thought content normal.  Nursing note and vitals reviewed.    ED Treatments / Results  Labs (all labs ordered are listed, but only abnormal results are displayed) Labs Reviewed - No data to display  EKG  EKG Interpretation None       Radiology Dg Lumbar Spine Complete  Result Date: 06/19/2017 CLINICAL DATA:  37 year old male with prior history of trauma after a fall off of a building complaining of low back pain. EXAM: LUMBAR SPINE - COMPLETE 4+ VIEW COMPARISON:  No priors. FINDINGS: There is no evidence of lumbar spine fracture. Alignment is normal. Intervertebral disc spaces are maintained. IMPRESSION: Negative. Electronically Signed   By: Trudie Reed M.D.   On: 06/19/2017 10:39   Dg Sacrum/coccyx  Result Date: 06/19/2017 CLINICAL DATA:  Pain following fall EXAM: SACRUM AND COCCYX - 2+ VIEW COMPARISON:  None. FINDINGS: Frontal, tilt frontal, and lateral views obtained. There is no fracture or diastases. Joint spaces appear normal. No erosive change. IMPRESSION: Fracture or diastases.  No evident arthropathy. Electronically Signed   By: Bretta Bang III M.D.   On: 06/19/2017 10:39    Procedures Procedures (including critical care time)  Medications Ordered in ED Medications - No data to display   Initial Impression / Assessment and Plan / ED Course  I have reviewed the triage vital signs and the nursing notes.  Pertinent labs & imaging results that were available during my care of the patient were reviewed by me and considered in my medical decision making (see chart for details).     Final  Clinical Impressions(s) / ED Diagnoses   Final diagnoses:  Chronic bilateral low back pain without sciatica  Chronic pain of left knee    Labs:   Imaging: DG lumbar, DG sacrum  Consults:  Therapeutics:  Discharge Meds:   Assessment/Plan: 37 year old male presents today with chronic pain.  He has no red flags on exam, no signs of trauma.  Plain films reassuring.  He will be given back exercises referred to primary care and given strict return precautions.  He verbalized understanding and agreement to today's plan had    ED Discharge Orders    None       Rosalio Loud 06/19/17 1159    Loren Racer, MD 06/19/17 1538

## 2017-06-19 NOTE — ED Notes (Signed)
ZPt states he has unchanged pain at 7-8/10. Pt staets he understands instructions. Home stable with family mwith steady gait.

## 2017-06-19 NOTE — ED Triage Notes (Signed)
Pt reports falling two years ago and still has lower back pain and left knee pain. Ambulatory at triage with no acute distress noted.

## 2017-06-19 NOTE — ED Notes (Signed)
Pt returns from xray

## 2017-06-28 ENCOUNTER — Ambulatory Visit (HOSPITAL_COMMUNITY)
Admission: EM | Admit: 2017-06-28 | Discharge: 2017-06-28 | Disposition: A | Discharge status: Home / Self Care | Payer: Medicare Other | Attending: Family Medicine | Admitting: Family Medicine

## 2017-06-28 ENCOUNTER — Encounter (HOSPITAL_COMMUNITY): Payer: Self-pay | Admitting: Emergency Medicine

## 2017-06-28 DIAGNOSIS — B9689 Other specified bacterial agents as the cause of diseases classified elsewhere: Secondary | ICD-10-CM | POA: Diagnosis not present

## 2017-06-28 DIAGNOSIS — L02412 Cutaneous abscess of left axilla: Secondary | ICD-10-CM

## 2017-06-28 DIAGNOSIS — B958 Unspecified staphylococcus as the cause of diseases classified elsewhere: Secondary | ICD-10-CM | POA: Diagnosis not present

## 2017-06-28 HISTORY — PX: OTHER SURGICAL HISTORY: SHX169

## 2017-06-28 MED ORDER — LIDOCAINE HCL 2 % IJ SOLN
INTRAMUSCULAR | Status: AC
  Filled 2017-06-28: qty 20

## 2017-06-28 MED ORDER — IBUPROFEN 800 MG PO TABS
800.0000 mg | ORAL_TABLET | Freq: Once | ORAL | Status: AC
  Administered 2017-06-28: 800 mg via ORAL

## 2017-06-28 MED ORDER — IBUPROFEN 800 MG PO TABS: 800.0000 mg | ORAL_TABLET | Freq: Three times a day (TID) | ORAL | 0 refills | Status: DC

## 2017-06-28 MED ORDER — IBUPROFEN 800 MG PO TABS
ORAL_TABLET | ORAL | Status: AC
  Filled 2017-06-28: qty 1

## 2017-06-28 NOTE — ED Triage Notes (Signed)
PT has abscess under left arm for 4 days.

## 2017-06-28 NOTE — Discharge Instructions (Signed)
A small amount of drainage came out, but still with some remaining firmness, likely a cyst present.  Continue with warm compresses to the area 2-3 times a day to promote drainage. Ibuprofen three times a day, take with food. Please call and follow up with general surgery to look at your back cyst and to recheck your underarm abscess. I have cultured the drainage from the cyst, we will call you if antibiotics are warranted.

## 2017-06-28 NOTE — ED Provider Notes (Signed)
[facility]    CSN: [id number] Arrival date & time: 06/28/17  4098     History   Chief Complaint Chief Complaint  Patient presents with  . Abscess    HPI Ronald Stein is a 37 y.o. male.   Aylan presents with complaints of abscess under left axilla which he noticed 4 days ago. Has become painful, achy to arm. Has a cyst to his back which he had to have incised but it has since grown back. Without tenderness to it. No known fevers. Pain is 7/10. Has not drained.    ROS per HPI.       Past Medical History:  Diagnosis Date  . Bipolar 1 disorder (HCC)   . Schizophrenia [facility])     Patient Active Problem List   Diagnosis Date Noted  . Schizophrenia, unspecified type (HCC)     History reviewed. No pertinent surgical history.     Home Medications    Prior to Admission medications   Medication Sig Start Date End Date Taking? Authorizing Provider  albuterol (PROVENTIL HFA;VENTOLIN HFA) 108 (90 Base) MCG/ACT inhaler Inhale 2 puffs into the lungs every 6 (six) hours as needed for wheezing or shortness of breath. 04/29/17  Yes Bing Neighbors, FNP  amoxicillin-clavulanate (AUGMENTIN) 875-125 MG tablet Take 1 tablet by mouth 2 (two) times daily. 04/29/17   Bing Neighbors, FNP  baclofen (LIORESAL) 10 MG tablet Take 1 tablet (10 mg total) by mouth 3 (three) times daily. Patient not taking: Reported on 04/29/2017 01/16/17   Hedges, Tinnie Gens, PA-C  cetirizine (ZYRTEC) 10 MG tablet Take 1 tablet (10 mg total) by mouth daily. 04/29/17   Bing Neighbors, FNP  dextromethorphan (DELSYM) 30 MG/5ML liquid Take 30 mg by mouth 3 (three) times daily as needed for cough.    [provider]  ibuprofen (ADVIL,MOTRIN) 800 MG tablet Take 1 tablet (800 mg total) by mouth 3 (three) times daily. 06/28/17   Georgetta Haber, NP  methocarbamol (ROBAXIN) 500 MG tablet Take 1 tablet (500 mg total) by mouth 2 (two) times daily. Patient not taking: Reported on 04/29/2017  11/18/16   Barrett Henle, PA-C  montelukast (SINGULAIR) 10 MG tablet Take 1 tablet (10 mg total) by mouth at bedtime. 04/29/17   Bing Neighbors, FNP  naproxen (NAPROSYN) 500 MG tablet Take 1 tablet (500 mg total) by mouth 2 (two) times daily. Patient not taking: Reported on 04/29/2017 11/18/16   Barrett Henle, PA-C  QUEtiapine Fumarate (SEROQUEL PO) Take 400 mg by mouth at bedtime.    [provider]  sertraline (ZOLOFT) 100 MG tablet Take 100 mg by mouth daily.    [provider]    Family History Family History  Problem Relation Age of Onset  . Hypertension Mother     Social History Social History   Tobacco Use  . Smoking status: Current Every Day Smoker    Packs/day: 1.00    Types: Cigarettes  . Smokeless tobacco: Never Used  Substance Use Topics  . Alcohol use: Yes    Comment: occasionally  . Drug use: Yes    Types: Marijuana     Allergies   Ciprofloxacin hcl and Wool alcohol [lanolin]   Review of Systems Review of Systems   Physical Exam Triage Vital Signs ED Triage Vitals  Enc Vitals Group     BP 06/28/17 1915 120/79     Pulse Rate 06/28/17 1915 83     Resp 06/28/17 1915 16  Temp 06/28/17 1915 98.2 F (36.8 C)     Temp Source 06/28/17 1915 Oral     SpO2 06/28/17 1915 100 %     Weight 06/28/17 1914 160 lb (72.6 kg)     Height 06/28/17 1914 6' (1.829 m)     Head Circumference --      Peak Flow --      Pain Score 06/28/17 1914 8     Pain Loc --      Pain Edu? --      Excl. in GC? --    No data found.  Updated Vital Signs BP 120/79   Pulse 83   Temp 98.2 F (36.8 C) (Oral)   Resp 16   Ht 6' (1.829 m)   Wt 160 lb (72.6 kg)   SpO2 100%   BMI 21.70 kg/m   Visual Acuity Right Eye Distance:   Left Eye Distance:   Bilateral Distance:    Right Eye Near:   Left Eye Near:    Bilateral Near:     Physical Exam  Constitutional: He is oriented to person, place, and time. He appears well-developed and  well-nourished.  Cardiovascular: Normal rate and regular rhythm.  Pulmonary/Chest: Effort normal and breath sounds normal.  Neurological: He is alert and oriented to person, place, and time.  Skin: Skin is warm and dry.  Round mobile abscess, mildly firm to left axilla, approximately 0.75 cm in diameter, without surrounding tissue redness     UC Treatments / Results  Labs (all labs ordered are listed, but only abnormal results are displayed) Labs Reviewed  AEROBIC CULTURE (SUPERFICIAL SPECIMEN)    EKG  EKG Interpretation None       Radiology No results found.  Procedures Incision and Drainage Date/Time: 06/28/2017 7:59 PM Performed by: Georgetta HaberBurky, Natalie B, NP Authorized by: Mardella LaymanHagler, Brian, MD   Consent:    Consent obtained:  Verbal   Consent given by:  Patient   Risks discussed:  Incomplete drainage, pain, infection, damage to other organs and bleeding   Alternatives discussed:  No treatment, delayed treatment, alternative treatment, observation and referral Location:    Type:  Abscess   Size:  0.75cm   Location:  Upper extremity   Upper extremity location:  Arm   Arm location:  L upper arm (l axilla) Pre-procedure details:    Skin preparation:  Betadine Anesthesia (see MAR for exact dosages):    Anesthesia method:  Local infiltration   Local anesthetic:  Lidocaine 2% w/o epi Procedure type:    Complexity:  Simple Procedure details:    Needle aspiration: no     Incision types:  Single straight   Scalpel blade:  10   Wound management:  Probed and deloculated   Drainage:  Bloody and purulent   Drainage amount:  Scant   Wound treatment:  Wound left open   Packing materials:  None Post-procedure details:    Patient tolerance of procedure:  Tolerated well, no immediate complications Comments:     Small amount of thick white output initially   (including critical care time)  Medications Ordered in UC Medications  ibuprofen (ADVIL,MOTRIN) tablet 800 mg (not  administered)     Initial Impression / Assessment and Plan / UC Course  I have reviewed the triage vital signs and the nursing notes.  Pertinent labs & imaging results that were available during my care of the patient were reviewed by me and considered in my medical decision making (see chart for details).  Very little output, remained firm nodule after initial white discharge out. Gauze dressing placed and wound care discussed. Culture obtained. Follow up with surgery for back cyst check and axilla recheck. Patient verbalized understanding and agreeable to plan.    Final Clinical Impressions(s) / UC Diagnoses   Final diagnoses:  Abscess of left axilla    ED Discharge Orders        Ordered    ibuprofen (ADVIL,MOTRIN) 800 MG tablet  3 times daily     06/28/17 1955       Controlled Substance Prescriptions [place] Controlled Substance Registry consulted? Not Applicable   Georgetta Haber, NP 06/28/17 2001

## 2017-06-30 ENCOUNTER — Encounter: Payer: Self-pay | Admitting: Neurology

## 2017-06-30 ENCOUNTER — Ambulatory Visit (INDEPENDENT_AMBULATORY_CARE_PROVIDER_SITE_OTHER): Payer: Medicare Other | Admitting: Neurology

## 2017-06-30 VITALS — BP 103/61 | HR 67 | Ht 72.0 in | Wt 167.0 lb

## 2017-06-30 DIAGNOSIS — G43109 Migraine with aura, not intractable, without status migrainosus: Secondary | ICD-10-CM | POA: Diagnosis not present

## 2017-06-30 DIAGNOSIS — G43101 Migraine with aura, not intractable, with status migrainosus: Secondary | ICD-10-CM

## 2017-06-30 MED ORDER — RIZATRIPTAN BENZOATE 10 MG PO TABS: 10.0000 mg | ORAL_TABLET | ORAL | 11 refills | Status: AC | PRN

## 2017-06-30 NOTE — Patient Instructions (Signed)
Rizatriptan: Please take one tablet at the onset of your headache. If it does not improve the symptoms please take one additional tablet in 2 hours. Do not take more then 2 tablets in 24hrs. Do not take use more then 2 to 3 times in a week. May take with Excedrin, ibuprofen or tylenol  Rizatriptan tablets What is this medicine? RIZATRIPTAN (rye za TRIP tan) is used to treat migraines with or without aura. An aura is a strange feeling or visual disturbance that warns you of an attack. It is not used to prevent migraines. This medicine may be used for other purposes; ask your health care provider or pharmacist if you have questions. COMMON BRAND NAME(S): Maxalt What should I tell my health care provider before I take this medicine? They need to know if you have any of these conditions: -bowel disease or colitis -diabetes -family history of heart disease -fast or irregular heart beat -heart or blood vessel disease, angina (chest pain), or previous heart attack -high blood pressure -high cholesterol -history of stroke, transient ischemic attacks (TIAs or mini-strokes), or intracranial bleeding -kidney or liver disease -overweight -poor circulation -postmenopausal or surgical removal of uterus and ovaries -Raynaud's disease -seizure disorder -an unusual or allergic reaction to rizatriptan, other medicines, foods, dyes, or preservatives -pregnant or trying to get pregnant -breast-feeding How should I use this medicine? This medicine is taken by mouth with a glass of water. Follow the directions on the prescription label. This medicine is taken at the first symptoms of a migraine. It is not for everyday use. If your migraine headache returns after one dose, you can take another dose as directed. You must leave at least 2 hours between doses, and do not take more than 30 mg total in 24 hours. If there is no improvement at all after the first dose, do not take a second dose without talking to your  doctor or health care professional. Do not take your medicine more often than directed. Talk to your pediatrician regarding the use of this medicine in children. While this drug may be prescribed for children as young as 6 years for selected conditions, precautions do apply. Overdosage: If you think you have taken too much of this medicine contact a poison control center or emergency room at once. NOTE: This medicine is only for you. Do not share this medicine with others. What if I miss a dose? This does not apply; this medicine is not for regular use. What may interact with this medicine? Do not take this medicine with any of the following medicines: -amphetamine, dextroamphetamine or cocaine -dihydroergotamine, ergotamine, ergoloid mesylates, methysergide, or ergot-type medication - do not take within 24 hours of taking rizatriptan -feverfew -MAOIs like Carbex, Eldepryl, Marplan, Nardil, and Parnate - do not take rizatriptan within 2 weeks of stopping MAOI therapy. -other migraine medicines like almotriptan, eletriptan, naratriptan, sumatriptan, zolmitriptan - do not take within 24 hours of taking rizatriptan -tryptophan This medicine may also interact with the following medications: -medicines for mental depression, anxiety or mood problems -propranolol This list may not describe all possible interactions. Give your health care provider a list of all the medicines, herbs, non-prescription drugs, or dietary supplements you use. Also tell them if you smoke, drink alcohol, or use illegal drugs. Some items may interact with your medicine. What should I watch for while using this medicine? Only take this medicine for a migraine headache. Take it if you get warning symptoms or at the start of a migraine  attack. It is not for regular use to prevent migraine attacks. You may get drowsy or dizzy. Do not drive, use machinery, or do anything that needs mental alertness until you know how this medicine  affects you. To reduce dizzy or fainting spells, do not sit or stand up quickly, especially if you are an older patient. Alcohol can increase drowsiness, dizziness and flushing. Avoid alcoholic drinks. Smoking cigarettes may increase the risk of heart-related side effects from using this medicine. If you take migraine medicines for 10 or more days a month, your migraines may get worse. Keep a diary of headache days and medicine use. Contact your healthcare professional if your migraine attacks occur more frequently. What side effects may I notice from receiving this medicine? Side effects that you should report to your doctor or health care professional as soon as possible: -allergic reactions like skin rash, itching or hives, swelling of the face, lips, or tongue -fast, slow, or irregular heart beat -increased or decreased blood pressure -seizures -severe stomach pain and cramping, bloody diarrhea -signs and symptoms of a blood clot such as breathing problems; changes in vision; chest pain; severe, sudden headache; pain, swelling, warmth in the leg; trouble speaking; sudden numbness or weakness of the face, arm or leg -tingling, pain, or numbness in the face, hands, or feet Side effects that usually do not require medical attention (report to your doctor or health care professional if they continue or are bothersome): -drowsiness -dry mouth -feeling warm, flushing, or redness of the face -headache -muscle cramps, pain -nausea, vomiting -unusually weak or tired This list may not describe all possible side effects. Call your doctor for medical advice about side effects. You may report side effects to FDA at 1-800-FDA-1088. Where should I keep my medicine? Keep out of the reach of children. Store at room temperature between 15 and 30 degrees C (59 and 86 degrees F). Keep container tightly closed. Throw away any unused medicine after the expiration date. NOTE: This sheet is a summary. It may not  cover all possible information. If you have questions about this medicine, talk to your doctor, pharmacist, or health care provider.  2018 Elsevier/Gold Standard (2013-01-16 10:16:39)     Migraine Headache A migraine headache is an intense, throbbing pain on one side or both sides of the head. Migraines may also cause other symptoms, such as nausea, vomiting, and sensitivity to light and noise. What are the causes? Doing or taking certain things may also trigger migraines, such as:  Alcohol.  Smoking.  Medicines, such as: ? Medicine used to treat chest pain (nitroglycerine). ? Birth control pills. ? Estrogen pills. ? Certain blood pressure medicines.  Aged cheeses, chocolate, or caffeine.  Foods or drinks that contain nitrates, glutamate, aspartame, or tyramine.  Physical activity.  Other things that may trigger a migraine include:  Menstruation.  Pregnancy.  Hunger.  Stress, lack of sleep, too much sleep, or fatigue.  Weather changes.  What increases the risk? The following factors may make you more likely to experience migraine headaches:  Age. Risk increases with age.  Family history of migraine headaches.  Being Caucasian.  Depression and anxiety.  Obesity.  Being a woman.  Having a hole in the heart (patent foramen ovale) or other heart problems.  What are the signs or symptoms? The main symptom of this condition is pulsating or throbbing pain. Pain may:  Happen in any area of the head, such as on one side or both sides.  Interfere with  daily activities.  Get worse with physical activity.  Get worse with exposure to bright lights or loud noises.  Other symptoms may include:  Nausea.  Vomiting.  Dizziness.  General sensitivity to bright lights, loud noises, or smells.  Before you get a migraine, you may get warning signs that a migraine is developing (aura). An aura may include:  Seeing flashing lights or having blind  spots.  Seeing bright spots, halos, or zigzag lines.  Having tunnel vision or blurred vision.  Having numbness or a tingling feeling.  Having trouble talking.  Having muscle weakness.  How is this diagnosed? A migraine headache can be diagnosed based on:  Your symptoms.  A physical exam.  Tests, such as CT scan or MRI of the head. These imaging tests can help rule out other causes of headaches.  Taking fluid from the spine (lumbar puncture) and analyzing it (cerebrospinal fluid analysis, or CSF analysis).  How is this treated? A migraine headache is usually treated with medicines that:  Relieve pain.  Relieve nausea.  Prevent migraines from coming back.  Treatment may also include:  Acupuncture.  Lifestyle changes like avoiding foods that trigger migraines.  Follow these instructions at home: Medicines  Take over-the-counter and prescription medicines only as told by your health care provider.  Do not drive or use heavy machinery while taking prescription pain medicine.  To prevent or treat constipation while you are taking prescription pain medicine, your health care provider may recommend that you: ? Drink enough fluid to keep your urine clear or pale yellow. ? Take over-the-counter or prescription medicines. ? Eat foods that are high in fiber, such as fresh fruits and vegetables, whole grains, and beans. ? Limit foods that are high in fat and processed sugars, such as fried and sweet foods. Lifestyle  Avoid alcohol use.  Do not use any products that contain nicotine or tobacco, such as cigarettes and e-cigarettes. If you need help quitting, ask your health care provider.  Get at least 8 hours of sleep every night.  Limit your stress. General instructions   Keep a journal to find out what may trigger your migraine headaches. For example, write down: ? What you eat and drink. ? How much sleep you get. ? Any change to your diet or medicines.  If you  have a migraine: ? Avoid things that make your symptoms worse, such as bright lights. ? It may help to lie down in a dark, quiet room. ? Do not drive or use heavy machinery. ? Ask your health care provider what activities are safe for you while you are experiencing symptoms.  Keep all follow-up visits as told by your health care provider. This is important. Contact a health care provider if:  You develop symptoms that are different or more severe than your usual migraine symptoms. Get help right away if:  Your migraine becomes severe.  You have a fever.  You have a stiff neck.  You have vision loss.  Your muscles feel weak or like you cannot control them.  You start to lose your balance often.  You develop trouble walking.  You faint. This information is not intended to replace advice given to you by your health care provider. Make sure you discuss any questions you have with your health care provider. Document Released: 05/17/2005 Document Revised: 12/05/2015 Document Reviewed: 11/03/2015 Elsevier Interactive Patient Education  2017 [employer].

## 2017-06-30 NOTE — Progress Notes (Signed)
[facility]    Provider:  Dr Lucia GaskinsAhern Referring Provider: Bing NeighborsHarris, Kimberly S, FNP, My Alysia PennaLe O.D. Primary Care Physician:  Bing NeighborsHarris, Kimberly S, FNP  CC:  Headaches  HPI:  Ronald KearnsRicardo Stein is a 37 y.o. male here as a referral from Dr. Tiburcio PeaHarris for headaches. PMHx Migraines. Has been treated with glasses and with sinuses and feels better. Headaches started 7-8 months ago in the setting of stress. Headaches were in the front, temple areas, bilaterally, pressure, felt like his brain was "full", the pain was severe, he was sensitive to light, off balance, pulsating, nausea, vomiting would last for days. He got shots that helped, toradol. Last time he had an exam was 2 months ago but he still get tension headache with stress. His eyes hurt sometimes but he feels fine. Ibuprofen does no thelp with the headaches and he runs to the ED. Mother has migraines. No other focal neurologic deficits, associated symptoms, inciting events or modifiable factors. He sometimes sees flashing lights associated with the headaches.  Reviewed notes, labs and imaging from outside physicians, which showed:  RPR, TSH, CMP normal 03/2017  Reviewed referring physician notes.  He presented for an eye exam on April 02, 2017 which was the last visit.  Complaining of headaches in the back of the head difficulty sleeping.  Headaches wake him up in the middle of the night.  No strokelike symptoms.  Visual acuity was 20/20 OS and OD corrected, extraocular muscles were full and unrestricted, pupils were equally round and fixed reactive to light without an apparent pupillary defect, confrontation fields were full in all fields of gaze, slit-lamp examination was decreased, intraocular pressures were normal, dilated funduscopic exam was normal, slit-lamp exam was normal, normal ocular health OU.    CT Head 12/2016: showed No acute intracranial abnormalities including mass lesion or mass effect, hydrocephalus, extra-axial fluid  collection, midline shift, hemorrhage, or acute infarction, large ischemic events (personally reviewed images)    Review of Systems: Patient complains of symptoms per HPI as well as the following symptoms: headache. Pertinent negatives and positives per HPI. All others negative.   Social History   Socioeconomic History  . Marital status: Single    Spouse name: Not on file  . Number of children: 0  . Years of education: Not on file  . Highest education level: Some college, no degree  Social Needs  . Financial resource strain: Not on file  . Food insecurity - worry: Not on file  . Food insecurity - inability: Not on file  . Transportation needs - medical: Not on file  . Transportation needs - non-medical: Not on file  Occupational History  . Not on file  Tobacco Use  . Smoking status: Current Every Day Smoker    Packs/day: 0.25    Types: Cigarettes  . Smokeless tobacco: Never Used  Substance and Sexual Activity  . Alcohol use: Yes    Comment: occasionally  . Drug use: Yes    Types: Marijuana    Comment: used cocaine for 12 years, none in the last 9 months  . Sexual activity: Not on file  Other Topics Concern  . Not on file  Social History Narrative   Lives at home with mother   Right handed   Drinks 2 cups of caffeine daily    Family History  Problem Relation Age of Onset  . Hypertension Mother   . Migraines Mother     Past Medical History:  Diagnosis Date  . Bipolar 1  disorder (HCC)   . Migraine   . Schizophrenia [facility])     Past Surgical History:  Procedure Laterality Date  . Cyst Removal Axilla Left 06/28/2017    Current Outpatient Medications  Medication Sig Dispense Refill  . albuterol (PROVENTIL HFA;VENTOLIN HFA) 108 (90 Base) MCG/ACT inhaler Inhale 2 puffs into the lungs every 6 (six) hours as needed for wheezing or shortness of breath. 1 Inhaler 0  . cetirizine (ZYRTEC) 10 MG tablet Take 1 tablet (10 mg total) by mouth daily. 30 tablet 11  .  ibuprofen (ADVIL,MOTRIN) 200 MG tablet Take 800 mg by mouth every 8 (eight) hours as needed.    . rizatriptan (MAXALT) 10 MG tablet Take 1 tablet (10 mg total) by mouth as needed for migraine. May repeat in 2 hours if needed. Maximum twice in one day. 10 tablet 11   No current facility-administered medications for this visit.     Allergies as of 06/30/2017 - Review Complete 06/30/2017  Allergen Reaction Noted  . Ciprofloxacin hcl Swelling 06/29/2014  . Other Hives and Itching 06/30/2017    Vitals: BP 103/61 (BP Location: Right Arm, Patient Position: Sitting)   Pulse 67   Ht 6' (1.829 m)   Wt 167 lb (75.8 kg)   BMI 22.65 kg/m  Last Weight:  Wt Readings from Last 1 Encounters:  06/30/17 167 lb (75.8 kg)   Last Height:   Ht Readings from Last 1 Encounters:  06/30/17 6' (1.829 m)    Physical exam: Exam: Gen: NAD, conversant, well nourised, thin, well groomed                     CV: RRR, no MRG. No Carotid Bruits. No peripheral edema, warm, nontender Eyes: Conjunctivae clear without exudates or hemorrhage  Neuro: Detailed Neurologic Exam  Speech:    Speech is normal; fluent and spontaneous with normal comprehension.  Cognition:    The patient is oriented to person, place, and time;     recent and remote memory intact;     language fluent;     normal attention, concentration,     fund of knowledge Cranial Nerves:    The pupils are equal, round, and reactive to light. The fundi are normal and spontaneous venous pulsations are present. Visual fields are full to finger confrontation. Extraocular movements are intact. Trigeminal sensation is intact and the muscles of mastication are normal. The face is symmetric. The palate elevates in the midline. Hearing intact. Voice is normal. Shoulder shrug is normal. The tongue has normal motion without fasciculations.   Coordination:    Normal finger to nose and heel to shin. Normal rapid alternating movements.   Gait:    Heel-toe and  tandem gait are normal.   Motor Observation:    No asymmetry, no atrophy, and no involuntary movements noted. Tone:    Normal muscle tone.    Posture:    Posture is normal. normal erect    Strength:    Strength is V/V in the upper and lower limbs.      Sensation: intact to LT     Reflex Exam:  DTR's:    Deep tendon reflexes in the upper and lower extremities are symmetrical bilaterally.   Toes:    The toes are downgoing bilaterally.   Clonus:    Clonus is absent.      Assessment/Plan:  37 year old with migraines with aura without status migrainosus not intractable. He has not had a headache for several  months. Will discuss acute management, no preventative medication needed at this time.  For acute management: Maxalt at onset at headache. Discussed increase risk of stroke with patients with migraine with aura.  Discussed:To prevent or relieve headaches, try the following: Cool Compress. Lie down and place a cool compress on your head.  Avoid headache triggers. If certain foods or odors seem to have triggered your migraines in the past, avoid them. A headache diary might help you identify triggers.  Include physical activity in your daily routine. Try a daily walk or other moderate aerobic exercise.  Manage stress. Find healthy ways to cope with the stressors, such as delegating tasks on your to-do list.  Practice relaxation techniques. Try deep breathing, yoga, massage and visualization.  Eat regularly. Eating regularly scheduled meals and maintaining a healthy diet might help prevent headaches. Also, drink plenty of fluids.  Follow a regular sleep schedule. Sleep deprivation might contribute to headaches Consider biofeedback. With this mind-body technique, you learn to control certain bodily functions - such as muscle tension, heart rate and blood pressure - to prevent headaches or reduce headache pain.    Proceed to emergency room if you experience new or worsening  symptoms or symptoms do not resolve, if you have new neurologic symptoms or if headache is severe, or for any concerning symptom.   Provided education and documentation from American headache Society toolbox including articles on: chronic migraine medication overuse headache, chronic migraines, prevention of migraines, behavioral and other nonpharmacologic treatments for headache.      Naomie Dean, MD  [facility] [address] [place], [place] [address]  Phone [phone] Fax [phone]

## 2017-07-01 LAB — AEROBIC CULTURE W GRAM STAIN (SUPERFICIAL SPECIMEN)

## 2017-07-01 LAB — AEROBIC CULTURE  (SUPERFICIAL SPECIMEN)

## 2017-10-31 ENCOUNTER — Ambulatory Visit: Payer: Medicare Other | Admitting: Family Medicine

## 2017-12-07 DIAGNOSIS — F419 Anxiety disorder, unspecified: Secondary | ICD-10-CM | POA: Diagnosis not present

## 2018-01-25 DIAGNOSIS — F419 Anxiety disorder, unspecified: Secondary | ICD-10-CM | POA: Diagnosis not present

## 2018-01-26 DIAGNOSIS — Z23 Encounter for immunization: Secondary | ICD-10-CM | POA: Diagnosis not present

## 2018-01-31 IMAGING — CR DG LUMBAR SPINE COMPLETE 4+V
5 series · 5 of 5 positions shown · non-contrast
Comparison: No priors.

CLINICAL DATA: 36-year-old male with prior history of trauma after
a fall off of a [HOSPITAL] of low back pain.

EXAM:
LUMBAR SPINE - COMPLETE 4+ VIEW

[l-spine ap]
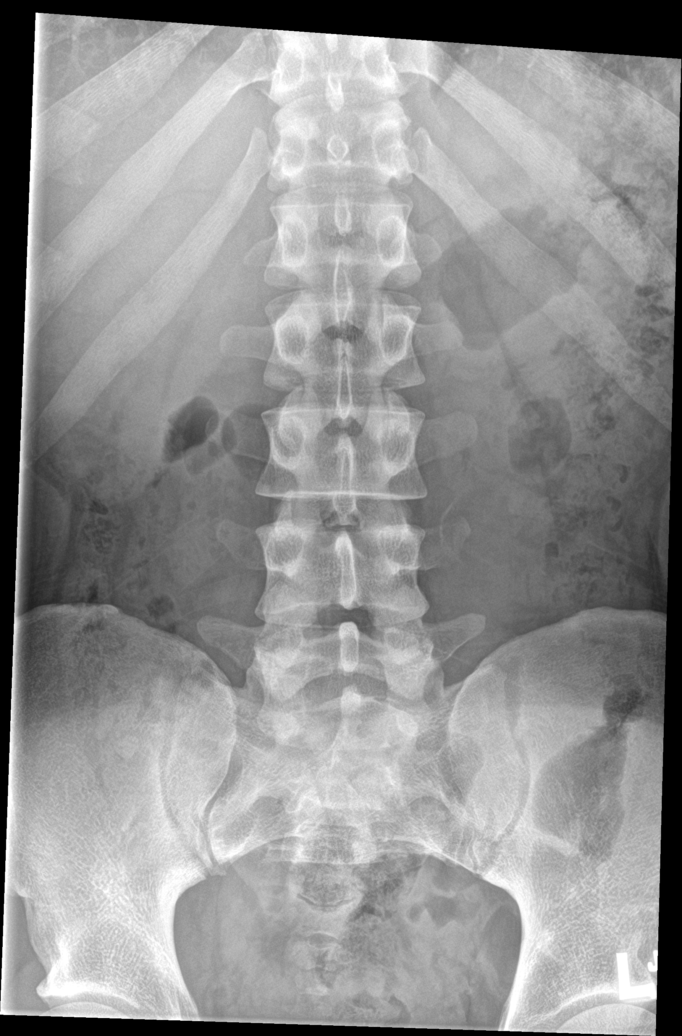

[l-spine obl (1 of 2)]
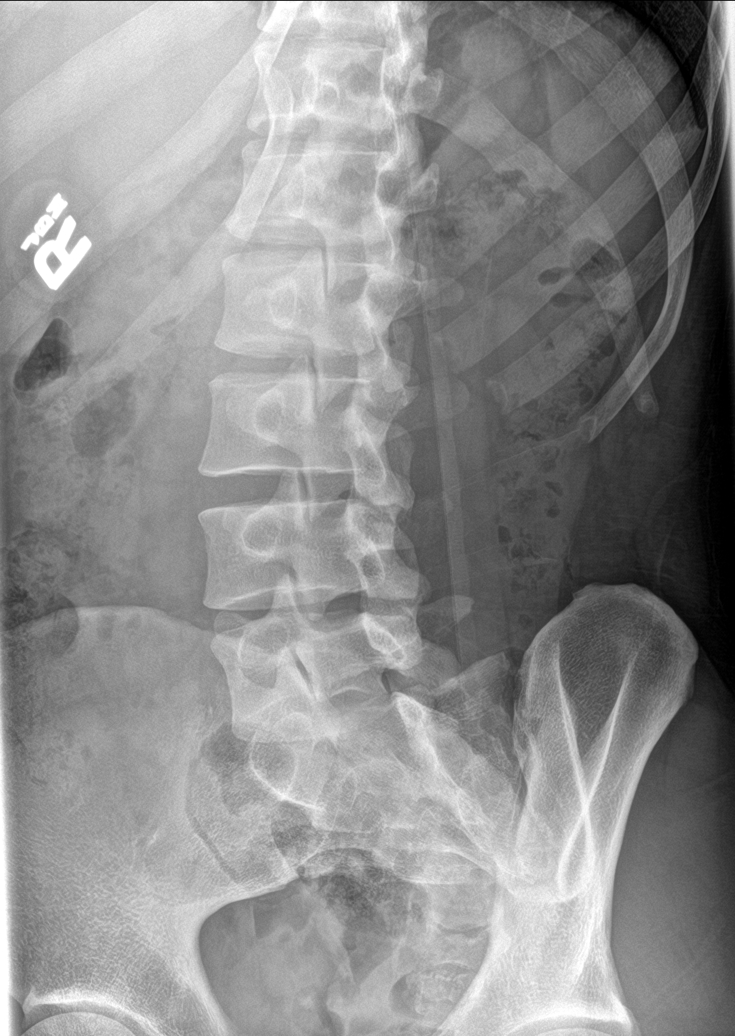

[l-spine obl (2 of 2)]
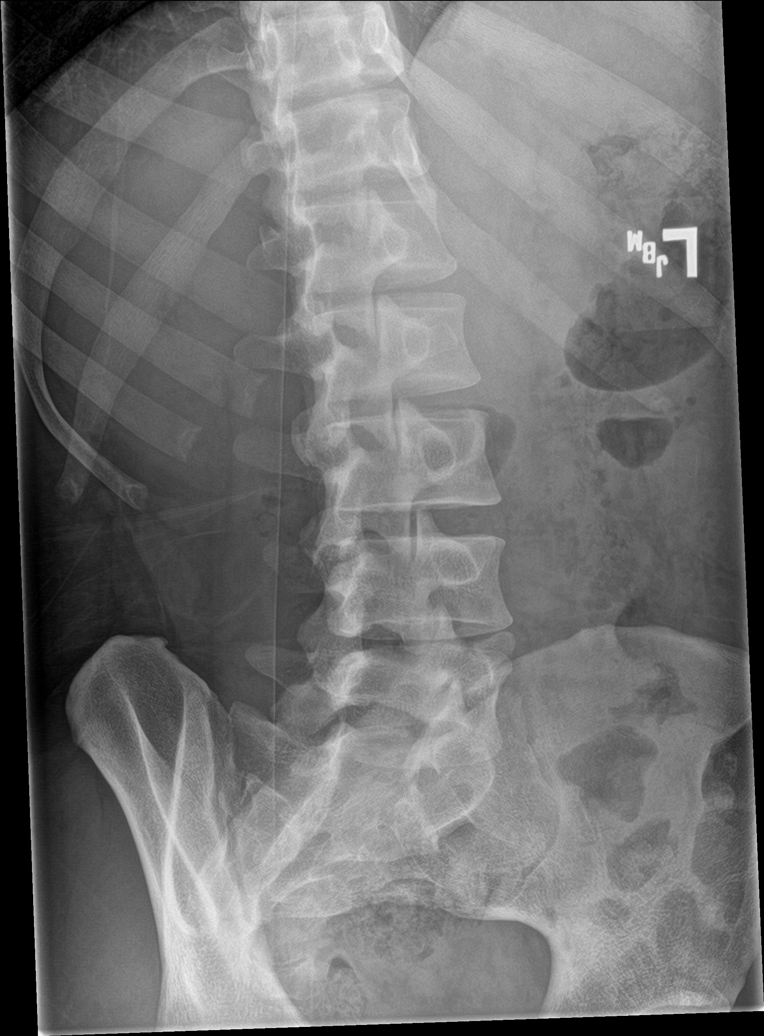

[l-spine lat]
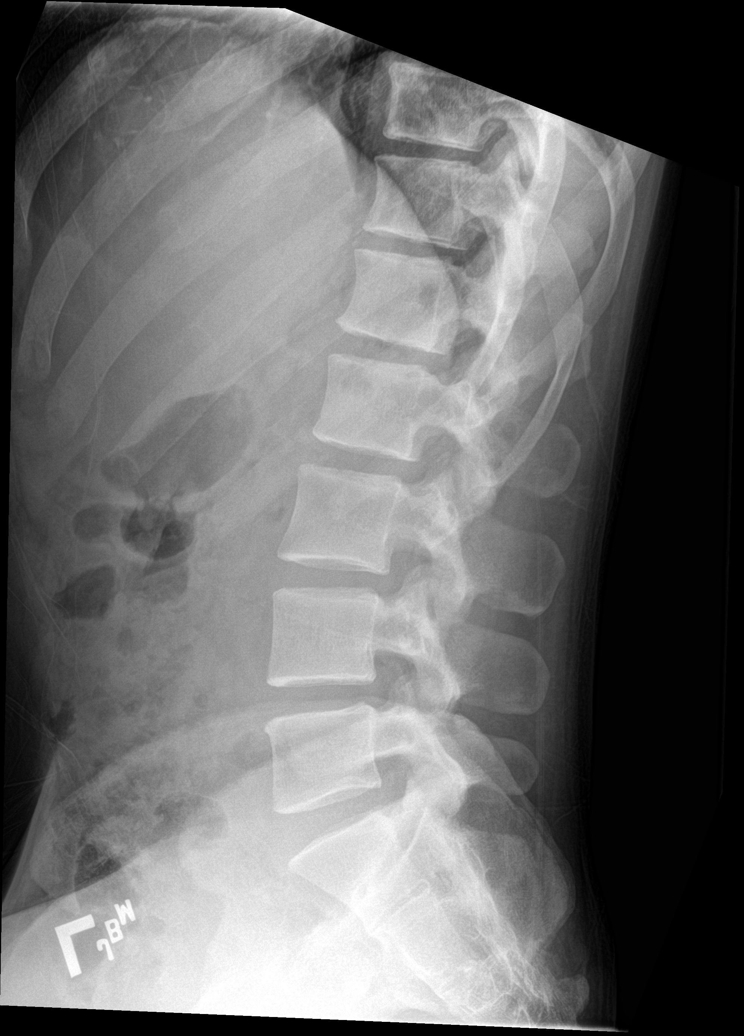

[l-spine spot]
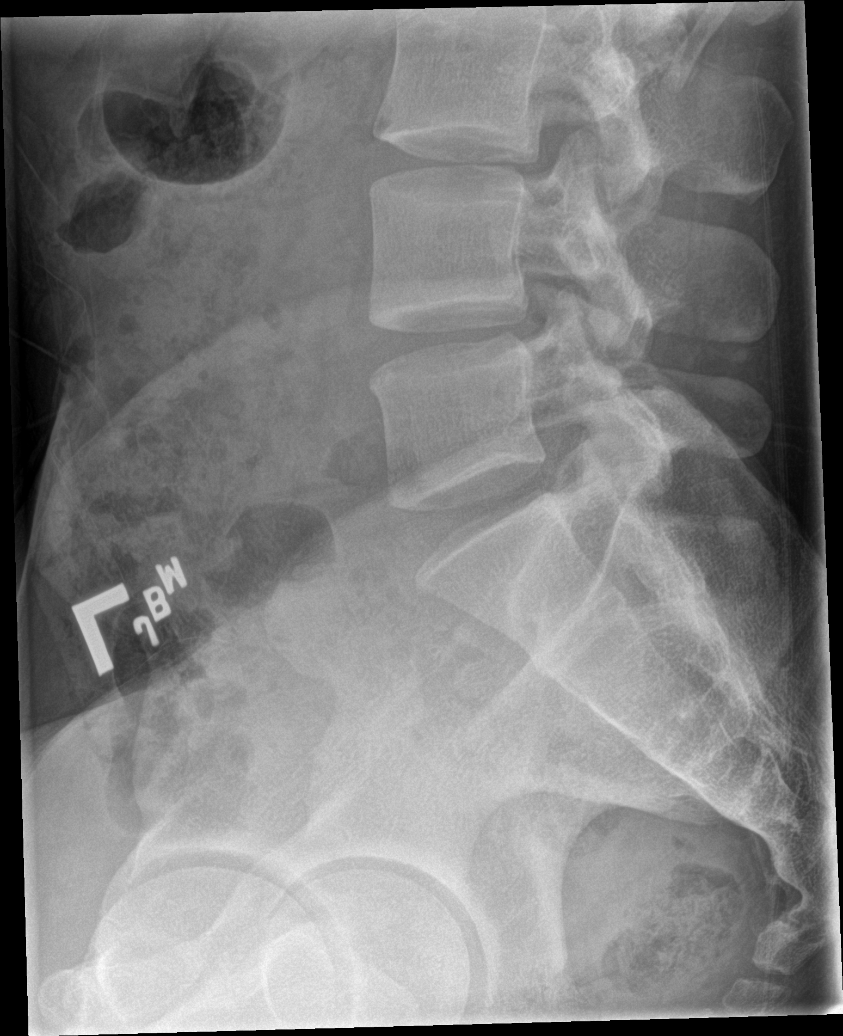

[5 of 5 positions shown; findings below may reference images not displayed]

FINDINGS: There is no evidence of lumbar spine fracture. Alignment is normal.
Intervertebral disc spaces are maintained.
IMPRESSION: Negative.

## 2018-02-15 DIAGNOSIS — F419 Anxiety disorder, unspecified: Secondary | ICD-10-CM | POA: Diagnosis not present

## 2018-03-02 ENCOUNTER — Telehealth: Payer: Self-pay

## 2018-03-02 NOTE — Telephone Encounter (Signed)
Patient will make his appointment at 2pm.

## 2018-03-03 ENCOUNTER — Ambulatory Visit: Payer: Medicare Other | Admitting: Family Medicine

## 2018-03-07 ENCOUNTER — Ambulatory Visit: Payer: Medicare Other | Admitting: Family Medicine

## 2018-03-14 ENCOUNTER — Ambulatory Visit: Payer: Medicare Other | Admitting: Family Medicine

## 2018-03-17 ENCOUNTER — Encounter: Payer: Self-pay | Admitting: Family Medicine

## 2018-03-17 ENCOUNTER — Ambulatory Visit (INDEPENDENT_AMBULATORY_CARE_PROVIDER_SITE_OTHER): Payer: Medicare Other | Admitting: Family Medicine

## 2018-03-17 VITALS — BP 118/68 | Temp 98.0°F | Ht 72.0 in | Wt 160.0 lb

## 2018-03-17 DIAGNOSIS — Z0000 Encounter for general adult medical examination without abnormal findings: Secondary | ICD-10-CM | POA: Diagnosis not present

## 2018-03-17 NOTE — Progress Notes (Signed)
Annual Physical  Subjective:    Patient ID: Ronald Stein, male    DOB: 09-May-1981, 37 y.o.   MRN: [id number]  HPI    Review of Systems  Respiratory: Negative.   Cardiovascular: Negative.      Objective:   Physical Exam      Assessment & Plan:

## 2018-03-21 ENCOUNTER — Telehealth: Payer: Self-pay

## 2018-03-21 NOTE — Telephone Encounter (Signed)
Called and spoke with patient, advised of appointment for 03/22/2018 and he states he will keep this appointment. Thanks!

## 2018-03-22 ENCOUNTER — Encounter: Payer: Self-pay | Admitting: Family Medicine

## 2018-03-22 ENCOUNTER — Ambulatory Visit (INDEPENDENT_AMBULATORY_CARE_PROVIDER_SITE_OTHER): Payer: Medicare Other | Admitting: Family Medicine

## 2018-03-22 VITALS — BP 110/70 | HR 94 | Temp 98.0°F | Ht 72.0 in | Wt 160.0 lb

## 2018-03-22 DIAGNOSIS — Z0000 Encounter for general adult medical examination without abnormal findings: Secondary | ICD-10-CM | POA: Diagnosis not present

## 2018-03-22 DIAGNOSIS — Z09 Encounter for follow-up examination after completed treatment for conditions other than malignant neoplasm: Secondary | ICD-10-CM | POA: Diagnosis not present

## 2018-03-22 DIAGNOSIS — G43101 Migraine with aura, not intractable, with status migrainosus: Secondary | ICD-10-CM | POA: Diagnosis not present

## 2018-03-22 DIAGNOSIS — Z716 Tobacco abuse counseling: Secondary | ICD-10-CM

## 2018-03-22 DIAGNOSIS — Z13220 Encounter for screening for lipoid disorders: Secondary | ICD-10-CM | POA: Diagnosis not present

## 2018-03-22 DIAGNOSIS — F17200 Nicotine dependence, unspecified, uncomplicated: Secondary | ICD-10-CM | POA: Diagnosis not present

## 2018-03-22 LAB — POCT URINALYSIS DIPSTICK
Bilirubin, UA: NEGATIVE
Glucose, UA: NEGATIVE
Ketones, UA: NEGATIVE
Leukocytes, UA: NEGATIVE
Nitrite, UA: NEGATIVE
Protein, UA: NEGATIVE
Spec Grav, UA: <=1.005 — AB (ref 1.010–1.025)
Urobilinogen, UA: 0.2 E.U./dL
pH, UA: 6 (ref 5.0–8.0)

## 2018-03-22 MED ORDER — VARENICLINE TARTRATE 0.5 MG PO TABS: 0.5000 mg | ORAL_TABLET | Freq: Two times a day (BID) | ORAL | 3 refills | Status: AC

## 2018-03-22 NOTE — Progress Notes (Signed)
Annual Physical--Follow Up  Subjective:    Patient ID: Ronald Stein, male    DOB: 07-24-1980, 37 y.o.   MRN: [id number]   Chief Complaint  Patient presents with  . Annual Exam   HPI   Ronald Stein is a 37 year old male with a past medical history of Schizophrenia, Migraine, and Bipolar Disorder. He is here today for his Annual Physical and assessment of chronic diseases.   Current Status: Since his last office visit, he is doing well with no complaints. He has not had any recent headaches. He has given up cigarettes and now smokes 3 cigars daily. He has not had any visual changes, dizziness, and falls. No chest pain, heart palpitations, cough and shortness of breath reported.   He denies fevers, chills, fatigue, recent infections, weight loss, and night sweats. No reports of GI problems such as nausea, vomiting, diarrhea, and constipation. He has no reports of blood in stools, dysuria and hematuria. No depression or anxiety, and denies suicidal ideations, homicidal ideations, or auditory hallucinations. He denies pain today.   Review of Systems  Constitutional: Negative.   HENT: Negative.   Eyes:       Glasses  Respiratory: Negative.   Cardiovascular: Negative.   Gastrointestinal: Negative.   Genitourinary: Negative.   Musculoskeletal: Negative.   Skin: Negative.   Neurological: Positive for headaches (Occasional).  Psychiatric/Behavioral: Negative.     Objective:   Physical Exam  Constitutional: He is oriented to person, place, and time. He appears well-developed and well-nourished.  HENT:  Head: Normocephalic and atraumatic.  Right Ear: External ear normal.  Left Ear: External ear normal.  Nose: Nose normal.  Mouth/Throat: Oropharynx is clear and moist.  Eyes: Pupils are equal, round, and reactive to light. Conjunctivae and EOM are normal.  Neck: Normal range of motion. Neck supple.  Cardiovascular: Normal rate, regular rhythm, normal heart sounds and intact distal pulses.   Pulmonary/Chest: Effort normal and breath sounds normal.  Abdominal: Soft. Bowel sounds are normal.  Musculoskeletal: Normal range of motion.  Neurological: He is alert and oriented to person, place, and time.  Skin: Skin is warm and dry.  Psychiatric: He has a normal mood and affect. His behavior is normal. Judgment and thought content normal.  Nursing note and vitals reviewed.  Assessment & Plan:   1. Annual physical exam  2. Smoker We will initiate Chantix today.  - varenicline (CHANTIX) 0.5 MG tablet; Take 1 tablet (0.5 mg total) by mouth 2 (two) times daily.  Dispense: 60 tablet; Refill: 3 - CBC with Differential  3. Migraine with aura and with status migrainosus, not intractable Stable.   4. Healthcare maintenance - Comprehensive metabolic panel; Future - Lipid Panel - Urinalysis Dipstick - Comprehensive metabolic panel  5. Encounter for smoking cessation counseling We will initiate Chantix today.  - varenicline (CHANTIX) 0.5 MG tablet; Take 1 tablet (0.5 mg total) by mouth 2 (two) times daily.  Dispense: 60 tablet; Refill: 3  6. Follow up He will follow up in 6 months.   Meds ordered this encounter  Medications  . varenicline (CHANTIX) 0.5 MG tablet    Sig: Take 1 tablet (0.5 mg total) by mouth 2 (two) times daily.    Dispense:  60 tablet    Refill:  3   Raliegh Ip,  MSN, [facility] Patient [facility] [address] [place], [place] [address] [phone]

## 2018-03-22 NOTE — Patient Instructions (Signed)
Varenicline oral tablets What is this medicine? VARENICLINE (var EN i kleen) is used to help people quit smoking. It can reduce the symptoms caused by stopping smoking. It is used with a patient support program recommended by your physician. This medicine may be used for other purposes; ask your health care provider or pharmacist if you have questions. COMMON BRAND NAME(S): Chantix What should I tell my health care provider before I take this medicine? They need to know if you have any of these conditions: -bipolar disorder, depression, schizophrenia or other mental illness -heart disease -if you often drink alcohol -kidney disease -peripheral vascular disease -seizures -stroke -suicidal thoughts, plans, or attempt; a previous suicide attempt by you or a family member -an unusual or allergic reaction to varenicline, other medicines, foods, dyes, or preservatives -pregnant or trying to get pregnant -breast-feeding How should I use this medicine? Take this medicine by mouth after eating. Take with a full glass of water. Follow the directions on the prescription label. Take your doses at regular intervals. Do not take your medicine more often than directed. There are 3 ways you can use this medicine to help you quit smoking; talk to your health care professional to decide which plan is right for you: 1) you can choose a quit date and start this medicine 1 week before the quit date, or, 2) you can start taking this medicine before you choose a quit date, and then pick a quit date between day 8 and 35 days of treatment, or, 3) if you are not sure that you are able or willing to quit smoking right away, start taking this medicine and slowly decrease the amount you smoke as directed by your health care professional with the goal of being cigarette-free by week 12 of treatment. Stick to your plan; ask about support groups or other ways to help you remain cigarette-free. If you are motivated to quit  smoking and did not succeed during a previous attempt with this medicine for reasons other than side effects, or if you returned to smoking after this treatment, speak with your health care professional about whether another course of this medicine may be right for you. A special MedGuide will be given to you by the pharmacist with each prescription and refill. Be sure to read this information carefully each time. Talk to your pediatrician regarding the use of this medicine in children. This medicine is not approved for use in children. Overdosage: If you think you have taken too much of this medicine contact a poison control center or emergency room at once. NOTE: This medicine is only for you. Do not share this medicine with others. What if I miss a dose? If you miss a dose, take it as soon as you can. If it is almost time for your next dose, take only that dose. Do not take double or extra doses. What may interact with this medicine? -alcohol or any product that contains alcohol -insulin -other stop smoking aids -theophylline -warfarin This list may not describe all possible interactions. Give your health care provider a list of all the medicines, herbs, non-prescription drugs, or dietary supplements you use. Also tell them if you smoke, drink alcohol, or use illegal drugs. Some items may interact with your medicine. What should I watch for while using this medicine? Visit your doctor or health care professional for regular check ups. Ask for ongoing advice and encouragement from your doctor or healthcare professional, friends, and family to help you quit. If   you smoke while on this medication, quit again Your mouth may get dry. Chewing sugarless gum or sucking hard candy, and drinking plenty of water may help. Contact your doctor if the problem does not go away or is severe. You may get drowsy or dizzy. Do not drive, use machinery, or do anything that needs mental alertness until you know how  this medicine affects you. Do not stand or sit up quickly, especially if you are an older patient. This reduces the risk of dizzy or fainting spells. Sleepwalking can happen during treatment with this medicine, and can sometimes lead to behavior that is harmful to you, other people, or property. Stop taking this medicine and tell your doctor if you start sleepwalking or have other unusual sleep-related activity. Decrease the amount of alcoholic beverages that you drink during treatment with this medicine until you know if this medicine affects your ability to tolerate alcohol. Some people have experienced increased drunkenness (intoxication), unusual or sometimes aggressive behavior, or no memory of things that have happened (amnesia) during treatment with this medicine. The use of this medicine may increase the chance of suicidal thoughts or actions. Pay special attention to how you are responding while on this medicine. Any worsening of mood, or thoughts of suicide or dying should be reported to your health care professional right away. What side effects may I notice from receiving this medicine? Side effects that you should report to your doctor or health care professional as soon as possible: -allergic reactions like skin rash, itching or hives, swelling of the face, lips, tongue, or throat -acting aggressive, being angry or violent, or acting on dangerous impulses -breathing problems -changes in vision -chest pain or chest tightness -confusion, trouble speaking or understanding -new or worsening depression, anxiety, or panic attacks -extreme increase in activity and talking (mania) -fast, irregular heartbeat -feeling faint or lightheaded, falls -fever -pain in legs when walking -problems with balance, talking, walking -redness, blistering, peeling or loosening of the skin, including inside the mouth -ringing in ears -seeing or hearing things that aren't there  (hallucinations) -seizures -sleepwalking -sudden numbness or weakness of the face, arm or leg -thoughts about suicide or dying, or attempts to commit suicide -trouble passing urine or change in the amount of urine -unusual bleeding or bruising -unusually weak or tired Side effects that usually do not require medical attention (report to your doctor or health care professional if they continue or are bothersome): -constipation -headache -nausea, vomiting -strange dreams -stomach gas -trouble sleeping This list may not describe all possible side effects. Call your doctor for medical advice about side effects. You may report side effects to FDA at 1-800-FDA-1088. Where should I keep my medicine? Keep out of the reach of children. Store at room temperature between 15 and 30 degrees C (59 and 86 degrees F). Throw away any unused medicine after the expiration date. NOTE: This sheet is a summary. It may not cover all possible information. If you have questions about this medicine, talk to your doctor, pharmacist, or health care provider.  2018 [employer] (2015-01-30 16:14:23)  

## 2018-03-23 LAB — CBC WITH DIFFERENTIAL/PLATELET
Basophils Absolute: 0 10*3/uL (ref 0.0–0.2)
Basos: 1 %
EOS (ABSOLUTE): 0.1 10*3/uL (ref 0.0–0.4)
Eos: 1 %
Hematocrit: 46.1 % (ref 37.5–51.0)
Hemoglobin: 16 g/dL (ref 13.0–17.7)
Immature Grans (Abs): 0 10*3/uL (ref 0.0–0.1)
Immature Granulocytes: 0 %
Lymphocytes Absolute: 2.7 10*3/uL (ref 0.7–3.1)
Lymphs: 40 %
MCH: 32.6 pg (ref 26.6–33.0)
MCHC: 34.7 g/dL (ref 31.5–35.7)
MCV: 94 fL (ref 79–97)
Monocytes Absolute: 0.4 10*3/uL (ref 0.1–0.9)
Monocytes: 6 %
Neutrophils Absolute: 3.6 10*3/uL (ref 1.4–7.0)
Neutrophils: 52 %
Platelets: 256 10*3/uL (ref 150–450)
RBC: 4.91 x10E6/uL (ref 4.14–5.80)
RDW: 12.3 % (ref 12.3–15.4)
WBC: 6.7 10*3/uL (ref 3.4–10.8)

## 2018-03-23 LAB — COMPREHENSIVE METABOLIC PANEL
ALT: 16 IU/L (ref 0–44)
AST: 23 IU/L (ref 0–40)
Albumin/Globulin Ratio: 2.5 — ABNORMAL HIGH (ref 1.2–2.2)
Albumin: 4 g/dL (ref 3.5–5.5)
Alkaline Phosphatase: 48 IU/L (ref 39–117)
BUN/Creatinine Ratio: 8 — ABNORMAL LOW (ref 9–20)
BUN: 9 mg/dL (ref 6–20)
Bilirubin Total: 0.3 mg/dL (ref 0.0–1.2)
CO2: 29 mmol/L (ref 20–29)
Calcium: 9.4 mg/dL (ref 8.7–10.2)
Chloride: 100 mmol/L (ref 96–106)
Creatinine, Ser: 1.09 mg/dL (ref 0.76–1.27)
GFR calc Af Amer: 100 mL/min/{1.73_m2} (ref >59)
GFR calc non Af Amer: 87 mL/min/{1.73_m2} (ref >59)
Globulin, Total: 1.6 g/dL (ref 1.5–4.5)
Glucose: 56 mg/dL — ABNORMAL LOW (ref 65–99)
Potassium: 4.7 mmol/L (ref 3.5–5.2)
Sodium: 141 mmol/L (ref 134–144)
Total Protein: 5.6 g/dL — ABNORMAL LOW (ref 6.0–8.5)

## 2018-03-23 LAB — LIPID PANEL
Chol/HDL Ratio: 2.5 ratio (ref 0.0–5.0)
Cholesterol, Total: 139 mg/dL (ref 100–199)
HDL: 55 mg/dL (ref >39)
LDL Calculated: 75 mg/dL (ref 0–99)
Triglycerides: 46 mg/dL (ref 0–149)
VLDL Cholesterol Cal: 9 mg/dL (ref 5–40)

## 2018-03-31 ENCOUNTER — Telehealth: Payer: Self-pay

## 2018-04-05 DIAGNOSIS — F419 Anxiety disorder, unspecified: Secondary | ICD-10-CM | POA: Diagnosis not present

## 2018-05-17 DIAGNOSIS — F419 Anxiety disorder, unspecified: Secondary | ICD-10-CM | POA: Diagnosis not present

## 2018-05-17 DIAGNOSIS — F902 Attention-deficit hyperactivity disorder, combined type: Secondary | ICD-10-CM | POA: Diagnosis not present

## 2018-06-05 ENCOUNTER — Telehealth: Payer: Self-pay

## 2018-06-05 NOTE — Telephone Encounter (Signed)
Labs have been faxed to [facility] per patient request at [phone]

## 2018-06-15 ENCOUNTER — Ambulatory Visit (HOSPITAL_COMMUNITY): Payer: Self-pay | Admitting: Psychiatry

## 2018-07-18 ENCOUNTER — Ambulatory Visit: Payer: Self-pay | Admitting: Family Medicine

## 2018-09-22 ENCOUNTER — Ambulatory Visit: Payer: Medicare Other | Admitting: Family Medicine

## 2018-10-05 NOTE — Telephone Encounter (Signed)
Message sent to provider 

## 2019-03-05 DIAGNOSIS — Z23 Encounter for immunization: Secondary | ICD-10-CM | POA: Diagnosis not present

## 2020-07-10 DIAGNOSIS — Z20822 Contact with and (suspected) exposure to covid-19: Secondary | ICD-10-CM | POA: Diagnosis not present

## 2020-07-10 DIAGNOSIS — Z03818 Encounter for observation for suspected exposure to other biological agents ruled out: Secondary | ICD-10-CM | POA: Diagnosis not present

## 2020-09-16 DIAGNOSIS — Z111 Encounter for screening for respiratory tuberculosis: Secondary | ICD-10-CM | POA: Diagnosis not present

## 2020-09-16 DIAGNOSIS — R590 Localized enlarged lymph nodes: Secondary | ICD-10-CM | POA: Diagnosis not present

## 2020-09-16 DIAGNOSIS — Z21 Asymptomatic human immunodeficiency virus [HIV] infection status: Secondary | ICD-10-CM | POA: Diagnosis not present

## 2020-09-18 DIAGNOSIS — Z23 Encounter for immunization: Secondary | ICD-10-CM | POA: Diagnosis not present

## 2020-09-22 DIAGNOSIS — B20 Human immunodeficiency virus [HIV] disease: Secondary | ICD-10-CM | POA: Diagnosis not present

## 2020-09-25 DIAGNOSIS — Z113 Encounter for screening for infections with a predominantly sexual mode of transmission: Secondary | ICD-10-CM | POA: Diagnosis not present

## 2020-09-25 DIAGNOSIS — B20 Human immunodeficiency virus [HIV] disease: Secondary | ICD-10-CM | POA: Diagnosis not present

## 2020-10-23 DIAGNOSIS — B20 Human immunodeficiency virus [HIV] disease: Secondary | ICD-10-CM | POA: Diagnosis not present

## 2020-10-23 DIAGNOSIS — F3131 Bipolar disorder, current episode depressed, mild: Secondary | ICD-10-CM | POA: Diagnosis not present
